# Patient Record
Sex: Male | Born: 1965 | Race: Black or African American | Hispanic: No | Marital: Married | Smoking: Current every day smoker
Health system: Southern US, Community
[De-identification: ages and names within clinical notes are randomized; demographics above are authoritative.]

## PROBLEM LIST (undated history)

## (undated) DIAGNOSIS — Z86718 Personal history of other venous thrombosis and embolism: Secondary | ICD-10-CM

## (undated) DIAGNOSIS — C801 Malignant (primary) neoplasm, unspecified: Secondary | ICD-10-CM

## (undated) DIAGNOSIS — I10 Essential (primary) hypertension: Secondary | ICD-10-CM

## (undated) HISTORY — PX: COLONOSCOPY: SHX174

## (undated) HISTORY — DX: Personal history of other venous thrombosis and embolism: Z86.718

## (undated) HISTORY — PX: TONSILLECTOMY: SUR1361

## (undated) HISTORY — PX: OTHER SURGICAL HISTORY: SHX169

---

## 2003-09-03 ENCOUNTER — Encounter: Admission: RE | Admit: 2003-09-03 | Discharge: 2003-09-03 | Payer: Self-pay | Admitting: Family Medicine

## 2003-09-07 ENCOUNTER — Encounter: Admission: RE | Admit: 2003-09-07 | Discharge: 2003-09-07 | Payer: Self-pay | Admitting: Family Medicine

## 2005-09-05 ENCOUNTER — Encounter: Admission: RE | Admit: 2005-09-05 | Discharge: 2005-09-05 | Payer: Self-pay | Admitting: Family Medicine

## 2006-10-24 ENCOUNTER — Emergency Department (HOSPITAL_COMMUNITY): Admission: EM | Admit: 2006-10-24 | Discharge: 2006-10-24 | Payer: Self-pay | Admitting: Emergency Medicine

## 2007-11-29 IMAGING — CR DG ABDOMEN ACUTE W/ 1V CHEST
3 series · 3 of 3 positions shown · non-contrast
Comparison: 

CLINICAL DATA: 40 year old with abdominal pain, nausea, and chills.
 ACUTE ABDOMINAL SERIES WITH CHEST ? 3 VIEWS:

[w chest pa]
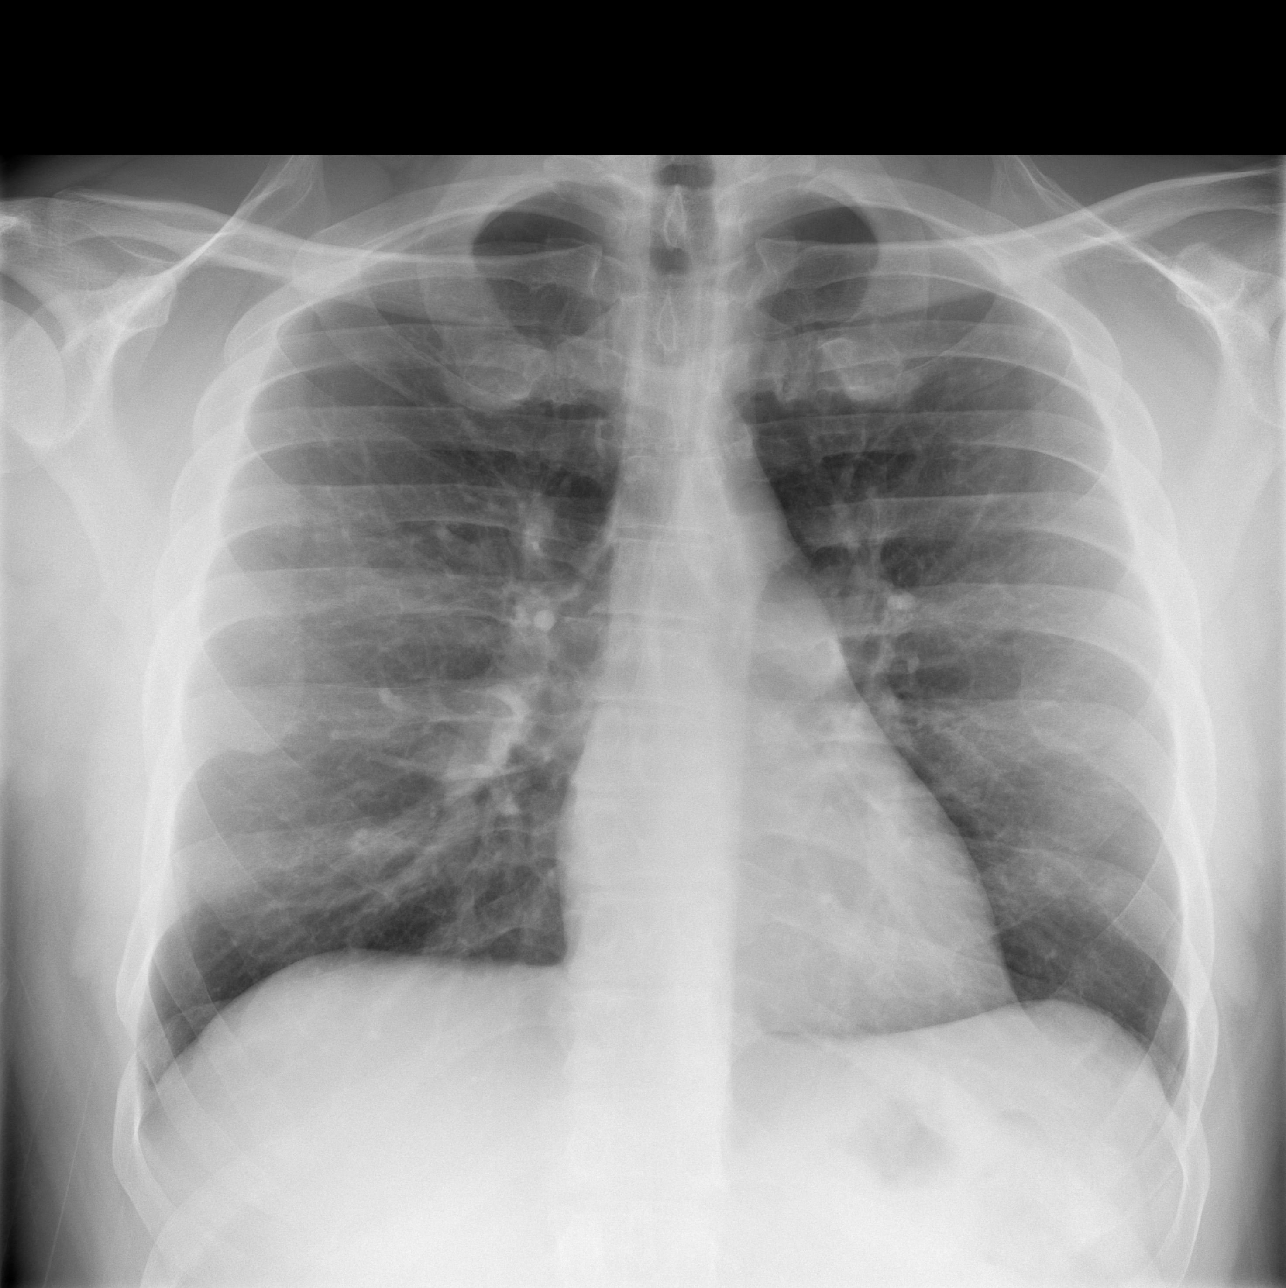

[w abdomen upright]
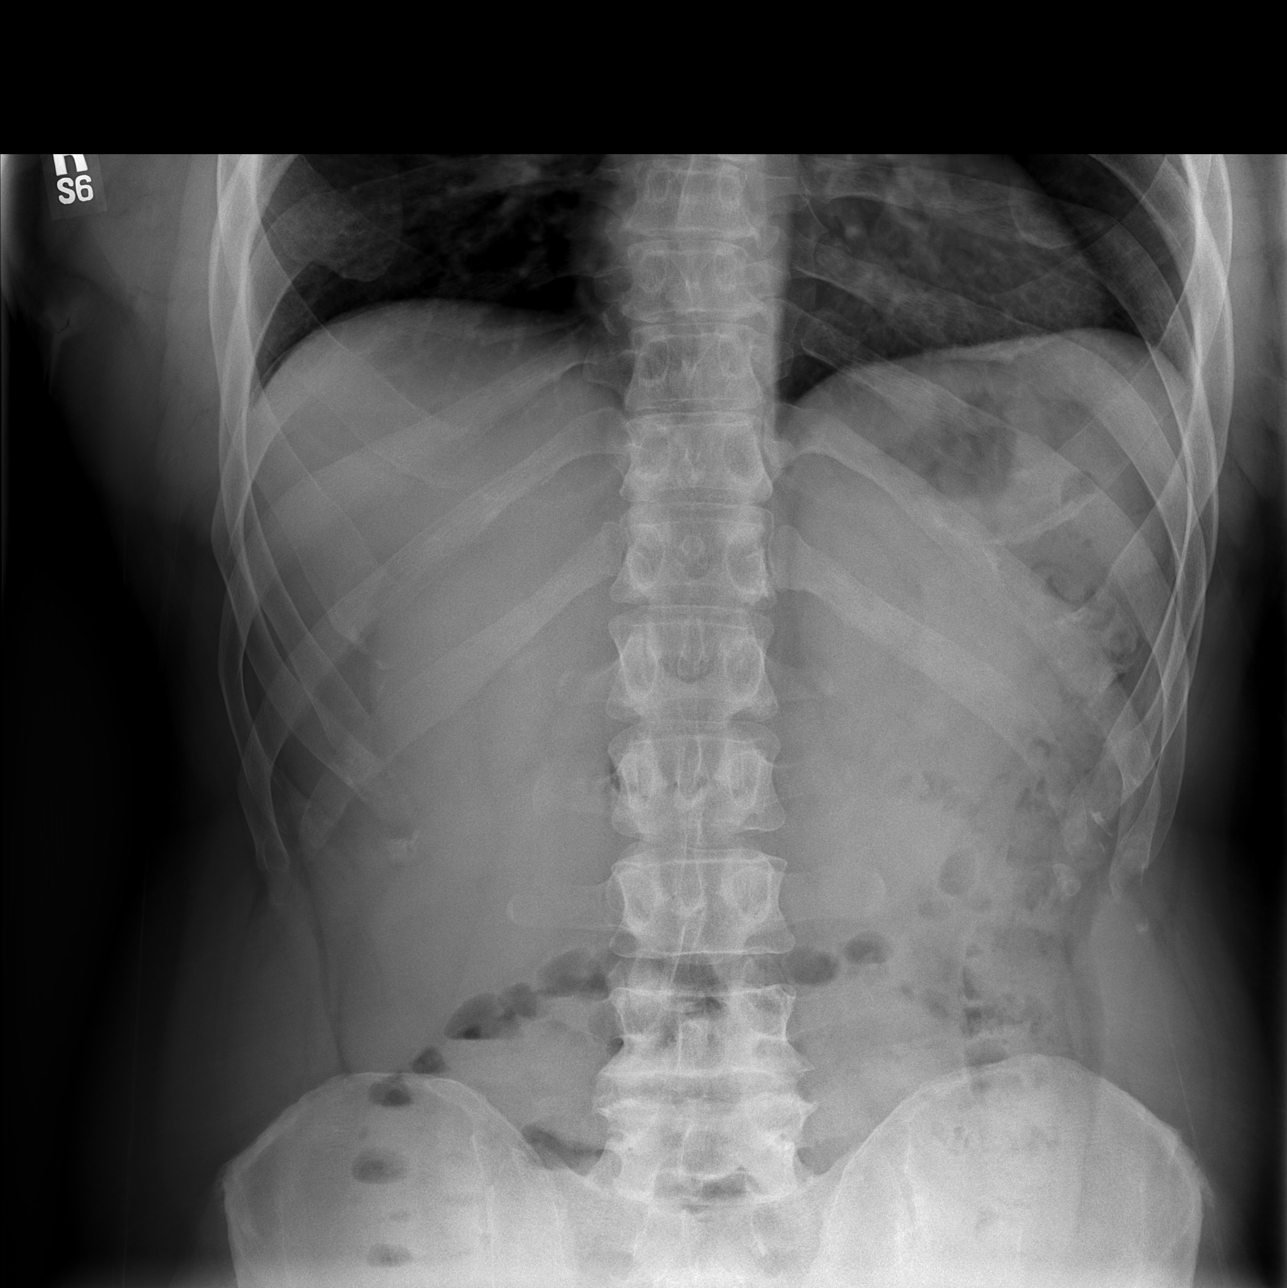

[t abdomen supine *]
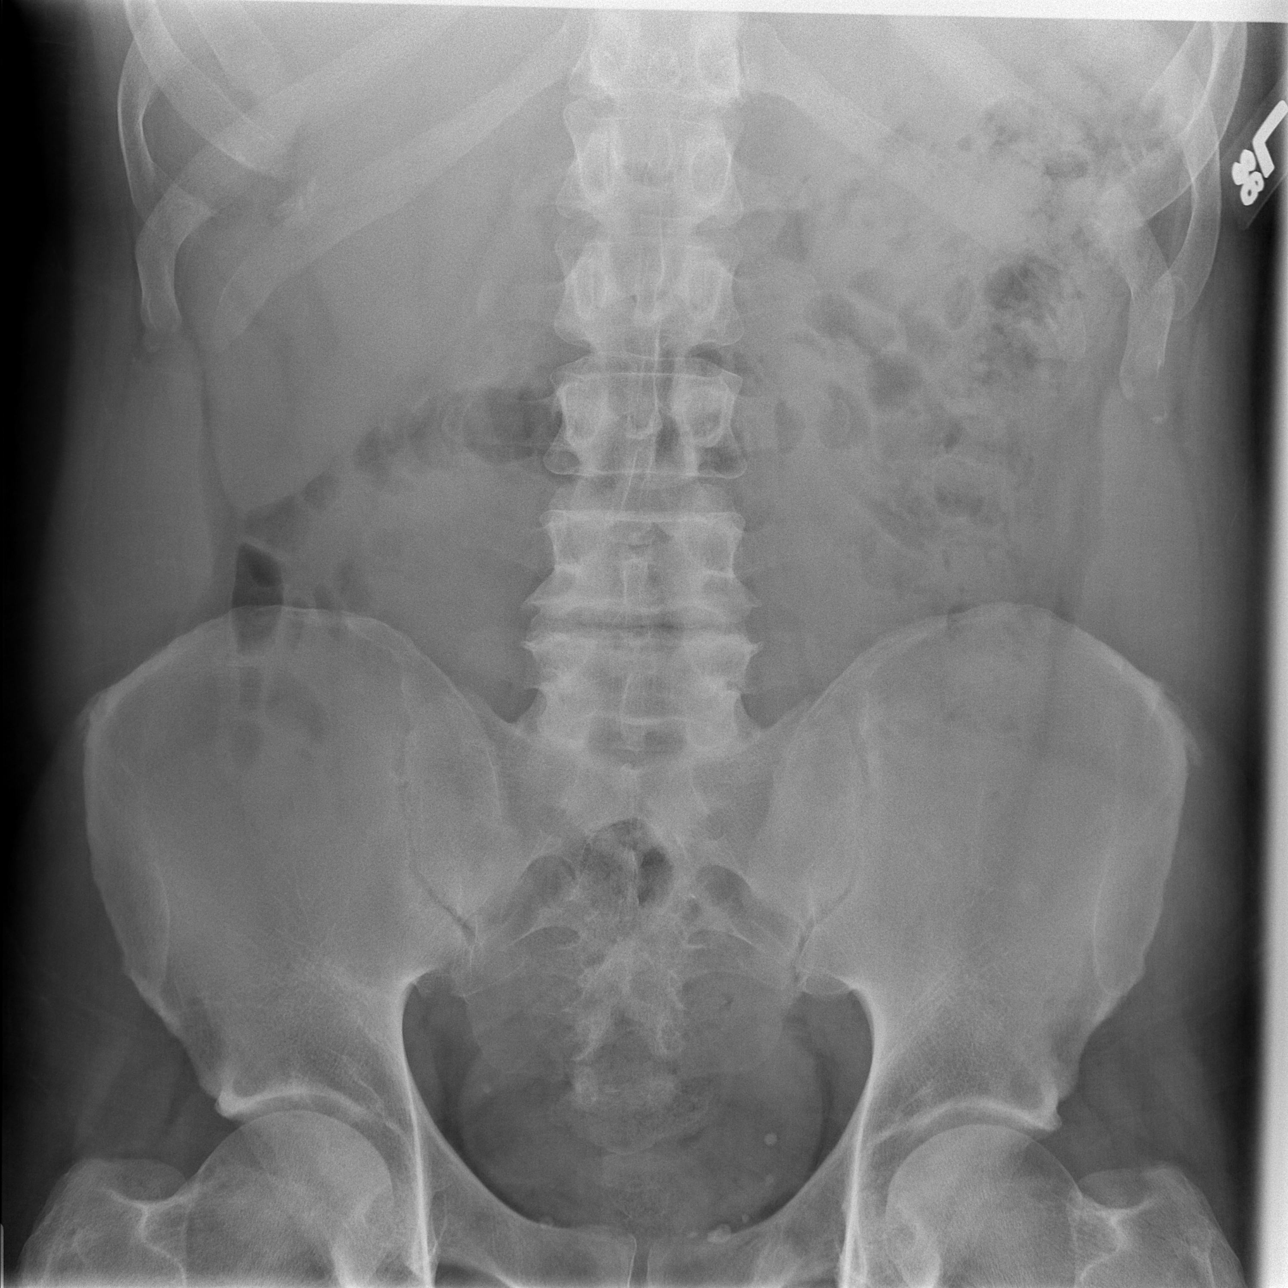

[3 of 3 positions shown; findings below may reference images not displayed]

FINDINGS: A single upright view of the chest demonstrates no acute cardiopulmonary findings.
 Two views of the abdomen demonstrate an unremarkable/nonspecific bowel gas pattern.   There is scattered air and some stool in the colon and a few small bowel loops with air but no distention.  No free air.  Soft tissue shadows of the abdomen are maintained.  No worrisome calcifications are seen.  The bony structures are intact.
IMPRESSION: 1.  No acute cardiopulmonary findings.
 2.  No plain film evidence of an acute abdominal process.

## 2008-03-28 ENCOUNTER — Emergency Department: Payer: Self-pay | Admitting: Emergency Medicine

## 2009-09-27 ENCOUNTER — Encounter (INDEPENDENT_AMBULATORY_CARE_PROVIDER_SITE_OTHER): Payer: Self-pay | Admitting: *Deleted

## 2009-11-03 ENCOUNTER — Encounter (INDEPENDENT_AMBULATORY_CARE_PROVIDER_SITE_OTHER): Payer: Self-pay | Admitting: *Deleted

## 2009-11-04 ENCOUNTER — Ambulatory Visit: Payer: Self-pay | Admitting: Internal Medicine

## 2009-11-15 ENCOUNTER — Ambulatory Visit: Payer: Self-pay | Admitting: Internal Medicine

## 2009-11-18 ENCOUNTER — Encounter: Payer: Self-pay | Admitting: Internal Medicine

## 2010-05-16 NOTE — Letter (Signed)
Summary: Patient Notice- Polyp Results  [facility]  [address] [place], [place] [address]   Phone: [phone]  Fax: [phone]        November 18, 2009 MRN: [id number]    [facility] [address] [place], [place]  [address]    Dear Mr. Wayne Mills,  The polyp removed from your colon was adenomatous. This means that it was pre-cancerous or that  it had the potential to change into cancer over time.  I recommend that you have a repeat colonoscopy in 5 years to determine if you have developed any new polyps over time. If you develop any new rectal bleeding, abdominal pain or significant bowel habit changes, please contact us before then.   Sincerely,  Iva Boop MD, [facility]  This letter has been electronically signed by your physician.  Appended Document: Patient Notice- Polyp Results letter mailed

## 2010-05-16 NOTE — Procedures (Signed)
Summary: Colonoscopy  Patient: Wayne Mills Note: All result statuses are Final unless otherwise noted.  Tests: (1) Colonoscopy (COL)   COL Colonoscopy           DONE     [facility]     [address]     [place], [place]  [address]           COLONOSCOPY PROCEDURE REPORT           PATIENT:  Wayne, Mills  MR#:  [id number]     BIRTHDATE:  May 24, 1965, 43 yrs. old  GENDER:  male     ENDOSCOPIST:  Iva Boop, MD, [facility]     REF. BY:  Lupe Carney, M.D.     PROCEDURE DATE:  11/15/2009     PROCEDURE:  Colonoscopy with snare polypectomy     ASA CLASS:  Class II     INDICATIONS:  Elevated Risk Screening, family history of colon     cancer father with colon cancer at approximately 47 years of age     MEDICATIONS:   Fentanyl 75 mcg IV, Versed 8 mg IV           DESCRIPTION OF PROCEDURE:   After the risks benefits and     alternatives of the procedure were thoroughly explained, informed     consent was obtained.  Digital rectal exam was performed and     revealed no abnormalities and normal prostate.   The LB CF-[id number]     [id number] endoscope was introduced through the anus and advanced to     the cecum, which was identified by both the appendix and ileocecal     valve, without limitations.  The quality of the prep was     excellent, using MoviPrep.  The instrument was then slowly     withdrawn as the colon was fully examined.     Insertion: 2:40 minutes Withdrawal: 10:09 minutes     <<PROCEDUREIMAGES>>           FINDINGS:  A diminutive polyp was found at the splenic flexure. It     was 4 mm in size. Polyp was snared without cautery. Retrieval was     successful. snare polyp  This was otherwise a normal examination     of the colon.   Retroflexed views in the rectum revealed no     abnormalities.    The scope was then withdrawn from the patient     and the procedure completed.           COMPLICATIONS:  None     ENDOSCOPIC IMPRESSION:     1) 4 mm diminutive polyp at the  splenic flexure - removed           2) Otherwise normal examination, excellent prep           3) Family history of colon cancer (father at 13)           REPEAT EXAM:  In for Colonoscopy, pending biopsy results.           Iva Boop, MD, [facility]           CC:  Lupe Carney, MD     The Patient           n.     Rosalie Doctor:   Iva Boop at 11/15/2009 10:19 AM           Lorie Apley, [id number]  Note: An exclamation mark (!) indicates a  result that was not dispersed into the flowsheet. Document Creation Date: 11/15/2009 10:21 AM _______________________________________________________________________  (1) Order result status: Final Collection or observation date-time: 11/15/2009 10:11 Requested date-time:  Receipt date-time:  Reported date-time:  Referring Physician:   Ordering Physician: Stan Head [phone]) Specimen Source:  Source: Launa Grill Order Number: [id number] Lab site:   Appended Document: Colonoscopy     Procedures Next Due Date:    Colonoscopy: 11/2014

## 2010-05-16 NOTE — Letter (Signed)
Summary: Moviprep Instructions  [facility]  [address]   [place], [place] [address]   Phone: [phone]  Fax: [phone]       Wayne Mills    [birth year]    MRN: [id number]        Procedure Day Dorna Bloom: Tuesday, 47-2-11     Arrival Time: 8:30 a.m.      Procedure Time: 9:30 a.m.     Location of Procedure:                    x   [facility] (4th Floor)   PREPARATION FOR COLONOSCOPY WITH MOVIPREP   Starting 5 days prior to your procedure 47-28-11  do not eat nuts, seeds, popcorn, corn, beans, peas,  salads, or any raw vegetables.  Do not take any fiber supplements (e.g. Metamucil, Citrucel, and Benefiber).  THE DAY BEFORE YOUR PROCEDURE         DATE:  47-1-11   DAY: Monday 1.  Drink clear liquids the entire day-NO SOLID FOOD  2.  Do not drink anything colored red or purple.  Avoid juices with pulp.  No orange juice.  3.  Drink at least 64 oz. (8 glasses) of fluid/clear liquids during the day to prevent dehydration and help the prep work efficiently.  CLEAR LIQUIDS INCLUDE: Water Jello Ice Popsicles Tea (sugar ok, no milk/cream) Powdered fruit flavored drinks Coffee (sugar ok, no milk/cream) Gatorade Juice: apple, white grape, white cranberry  Lemonade Clear bullion, consomm, broth Carbonated beverages (any kind) Strained chicken noodle soup Hard Candy                             4.  In the morning, mix first dose of MoviPrep solution:    Empty 1 Pouch A and 1 Pouch B into the disposable container    Add lukewarm drinking water to the top line of the container. Mix to dissolve    Refrigerate (mixed solution should be used within 24 hrs)  5.  Begin drinking the prep at 5:00 p.m. The MoviPrep container is divided by 4 marks.   Every 15 minutes drink the solution down to the next mark (approximately 8 oz) until the full liter is complete.   6.  Follow completed prep with 16 oz of clear liquid of your choice (Nothing red or purple).   Continue to drink clear liquids until bedtime.  7.  Before going to bed, mix second dose of MoviPrep solution:    Empty 1 Pouch A and 1 Pouch B into the disposable container    Add lukewarm drinking water to the top line of the container. Mix to dissolve    Refrigerate  THE DAY OF YOUR PROCEDURE      DATE: 47-2-11   DAY: Tuesday  Beginning at  4:30 a.m. (5 hours before procedure):         1. Every 15 minutes, drink the solution down to the next mark (approx 8 oz) until the full liter is complete.  2. Follow completed prep with 16 oz. of clear liquid of your choice.    3. You may drink clear liquids until  7:30 a.m.  (2 HOURS BEFORE PROCEDURE).   MEDICATION INSTRUCTIONS  Unless otherwise instructed, you should take regular prescription medications with a small sip of water   as early as possible the morning of your procedure.   Additional medication instructions: n/a  OTHER INSTRUCTIONS  You will need a responsible adult at least 47 years of age to accompany you and drive you home.   This person must remain in the waiting room during your procedure.  Wear loose fitting clothing that is easily removed.  Leave jewelry and other valuables at home.  However, you may wish to bring a book to read or  an iPod/MP3 player to listen to music as you wait for your procedure to start.  Remove all body piercing jewelry and leave at home.  Total time from sign-in until discharge is approximately 2-3 hours.  You should go home directly after your procedure and rest.  You can resume normal activities the  day after your procedure.  The day of your procedure you should not:   Drive   Make legal decisions   Operate machinery   Drink alcohol   Return to work  You will receive specific instructions about eating, activities and medications before you leave.    The above instructions have been reviewed and explained to me by  Sherren Kerns RN  November 04, 2045 4:10 PM       I fully understand and can verbalize these instructions _____________________________ Date _________

## 2010-05-16 NOTE — Miscellaneous (Signed)
Summary: direct colon//family hx of cancer/previsit/rm  Clinical Lists Changes  Medications: Added new medication of MOVIPREP 100 GM  SOLR (PEG-KCL-NACL-NASULF-NA ASC-C) As per prep instructions. - Signed Rx of MOVIPREP 100 GM  SOLR (PEG-KCL-NACL-NASULF-NA ASC-C) As per prep instructions.;  #1 x 0;  Signed;  Entered by: Sherren Kerns RN;  Authorized by: Iva Boop MD, [facility];  Method used: [facility]  [address] [phone]*, [address], [place], [place], [place]  [id number], Ph: [phone] or [phone], Fax: [phone] Allergies: Added new allergy or adverse reaction of PENICILLIN Observations: Added new observation of NKA: F (11/04/2009 15:58)    Prescriptions: MOVIPREP 100 GM  SOLR (PEG-KCL-NACL-NASULF-NA ASC-C) As per prep instructions.  #1 x 0   Entered by:   Sherren Kerns RN   Authorized by:   Iva Boop MD, [facility]   Signed by:   Sherren Kerns RN on 11/04/2009   Method used:   [facility]        [facility]  [address] [phone]* (retail)       [address]       [place], [place]  [id number]       Ph: [phone] or [phone]       Fax: [phone]   RxID:   [id number]

## 2010-05-16 NOTE — Letter (Signed)
Summary: Previsit letter  [facility]  [address] [place], [place] [address]   Phone: [phone]  Fax: [phone]       09/27/2009 MRN: [id number]  [facility] [address] [place], [place]  [address]  Dear Wayne Mills,  Welcome to the Gastroenterology Division at [facility].    You are scheduled to see a nurse for your pre-procedure visit on 11-04-09 at 4:00p.m. on the 3rd floor at [facility], [address]  We ask that you try to arrive at our office 15 minutes prior to your appointment time to allow for check-in.  Your nurse visit will consist of discussing your medical and surgical history, your immediate family medical history, and your medications.    Please bring a complete list of all your medications or, if you prefer, bring the medication bottles and we will list them.  We will need to be aware of both prescribed and over the counter drugs.  We will need to know exact dosage information as well.  If you are on blood thinners (Coumadin, Plavix, Aggrenox, Ticlid, etc.) please call our office today/prior to your appointment, as we need to consult with your physician about holding your medication.   Please be prepared to read and sign documents such as consent forms, a financial agreement, and acknowledgement forms.  If necessary, and with your consent, a friend or relative is welcome to sit-in on the nurse visit with you.  Please bring your insurance card so that we may make a copy of it.  If your insurance requires a referral to see a specialist, please bring your referral form from your primary care physician.  No co-pay is required for this nurse visit.     If you cannot keep your appointment, please call [phone] to cancel or reschedule prior to your appointment date.  This allows [place] the opportunity to schedule an appointment for another patient in need of care.    Thank you for choosing [facility] for your medical  needs.  We appreciate the opportunity to care for you.  Please visit [place] at our website  to learn more about our practice.                     Sincerely.                                                                                                                   The Gastroenterology Division

## 2011-01-30 LAB — COMPREHENSIVE METABOLIC PANEL
AST: 34
CO2: 27
Calcium: 8.8
Creatinine, Ser: 1.16
GFR calc Af Amer: >60
Glucose, Bld: 124 — ABNORMAL HIGH
Sodium: 132 — ABNORMAL LOW
Total Protein: 6.8

## 2011-01-30 LAB — DIFFERENTIAL
Basophils Absolute: 0.1
Basophils Relative: 0
Eosinophils Absolute: 0
Lymphocytes Relative: 6 — ABNORMAL LOW
Monocytes Absolute: 1 — ABNORMAL HIGH
Monocytes Relative: 5

## 2011-01-30 LAB — CBC
HCT: 40.8
Hemoglobin: 13.9
MCHC: 34.1
MCV: 89.2
RBC: 4.57

## 2011-01-30 LAB — URINE CULTURE: Colony Count: >=100000

## 2011-01-30 LAB — URINALYSIS, ROUTINE W REFLEX MICROSCOPIC
Ketones, ur: NEGATIVE
Nitrite: POSITIVE — AB
pH: 6

## 2011-01-30 LAB — URINE MICROSCOPIC-ADD ON

## 2011-08-29 DIAGNOSIS — K220 Achalasia of cardia: Secondary | ICD-10-CM | POA: Insufficient documentation

## 2015-01-17 ENCOUNTER — Encounter: Payer: Self-pay | Admitting: Internal Medicine

## 2015-04-27 ENCOUNTER — Encounter (HOSPITAL_COMMUNITY): Payer: Self-pay

## 2015-04-27 ENCOUNTER — Emergency Department (HOSPITAL_COMMUNITY)
Admission: EM | Admit: 2015-04-27 | Discharge: 2015-04-27 | Discharge status: Against Medical Advice | Payer: Self-pay | Attending: Emergency Medicine | Admitting: Emergency Medicine

## 2015-04-27 DIAGNOSIS — F17200 Nicotine dependence, unspecified, uncomplicated: Secondary | ICD-10-CM | POA: Insufficient documentation

## 2015-04-27 DIAGNOSIS — I10 Essential (primary) hypertension: Secondary | ICD-10-CM | POA: Insufficient documentation

## 2015-04-27 DIAGNOSIS — R109 Unspecified abdominal pain: Secondary | ICD-10-CM | POA: Insufficient documentation

## 2015-04-27 HISTORY — DX: Essential (primary) hypertension: I10

## 2015-04-27 LAB — CBC
HEMATOCRIT: 41.9 % (ref 39.0–52.0)
Hemoglobin: 14.1 g/dL (ref 13.0–17.0)
MCH: 30.3 pg (ref 26.0–34.0)
MCHC: 33.7 g/dL (ref 30.0–36.0)
MCV: 90.1 fL (ref 78.0–100.0)
PLATELETS: 217 10*3/uL (ref 150–400)
RBC: 4.65 MIL/uL (ref 4.22–5.81)
RDW: 14.2 % (ref 11.5–15.5)
WBC: 4.7 10*3/uL (ref 4.0–10.5)

## 2015-04-27 LAB — COMPREHENSIVE METABOLIC PANEL
ALK PHOS: 57 U/L (ref 38–126)
ALT: 106 U/L — AB (ref 17–63)
AST: 73 U/L — AB (ref 15–41)
Albumin: 3.8 g/dL (ref 3.5–5.0)
Anion gap: 10 (ref 5–15)
BILIRUBIN TOTAL: 0.4 mg/dL (ref 0.3–1.2)
BUN: 23 mg/dL — ABNORMAL HIGH (ref 6–20)
CHLORIDE: 101 mmol/L (ref 101–111)
CO2: 28 mmol/L (ref 22–32)
CREATININE: 1.59 mg/dL — AB (ref 0.61–1.24)
Calcium: 9.6 mg/dL (ref 8.9–10.3)
GFR, EST AFRICAN AMERICAN: 56 mL/min — AB (ref >60)
GFR, EST NON AFRICAN AMERICAN: 49 mL/min — AB (ref >60)
Glucose, Bld: 113 mg/dL — ABNORMAL HIGH (ref 65–99)
POTASSIUM: 4.9 mmol/L (ref 3.5–5.1)
Sodium: 139 mmol/L (ref 135–145)
Total Protein: 7.3 g/dL (ref 6.5–8.1)

## 2015-04-27 LAB — URINALYSIS, ROUTINE W REFLEX MICROSCOPIC
Bilirubin Urine: NEGATIVE
GLUCOSE, UA: NEGATIVE mg/dL
Hgb urine dipstick: NEGATIVE
Ketones, ur: NEGATIVE mg/dL
LEUKOCYTES UA: NEGATIVE
NITRITE: NEGATIVE
PROTEIN: 100 mg/dL — AB
Specific Gravity, Urine: 1.019 (ref 1.005–1.030)
pH: 5.5 (ref 5.0–8.0)

## 2015-04-27 LAB — URINE MICROSCOPIC-ADD ON

## 2015-04-27 LAB — LIPASE, BLOOD: LIPASE: 39 U/L (ref 11–51)

## 2015-04-27 NOTE — ED Notes (Signed)
Pt states that he feels much better and does not wish to be seen anymore.

## 2015-04-27 NOTE — ED Notes (Signed)
Pt states he ate supper and has been taking bactrim since Saturday for a UTI from the [place]. Started having abd pain after taking the antibiotic tonight and [facility] told [facility] if he was going to start having abd pain he would have had it by now from the medicine. No nausea or vomiting with the medicine.

## 2019-12-27 ENCOUNTER — Other Ambulatory Visit: Payer: Self-pay

## 2019-12-27 ENCOUNTER — Ambulatory Visit (HOSPITAL_COMMUNITY)
Admission: EM | Admit: 2019-12-27 | Discharge: 2019-12-27 | Disposition: A | Discharge status: Home / Self Care | Payer: Non-veteran care

## 2019-12-27 ENCOUNTER — Encounter (HOSPITAL_COMMUNITY): Payer: Self-pay | Admitting: Emergency Medicine

## 2019-12-27 DIAGNOSIS — M7989 Other specified soft tissue disorders: Secondary | ICD-10-CM

## 2019-12-27 MED ORDER — DIPHENHYDRAMINE HCL 25 MG PO TABS: 25.0000 mg | ORAL_TABLET | Freq: Two times a day (BID) | ORAL | 0 refills | Status: DC | PRN

## 2019-12-27 MED ORDER — PREDNISONE 10 MG PO TABS: ORAL_TABLET | ORAL | 0 refills | Status: DC

## 2019-12-27 NOTE — ED Triage Notes (Signed)
Pt c/o right hand pain and swelling onset yesterday upon waking. Pt states he was mowing the lawn on Friday and is unsure if something maybe bit him.

## 2019-12-27 NOTE — Discharge Instructions (Addendum)
Take the benadryl twice daily as needed until symptoms resolve - if this makes you sleepy you may swap out your morning dose for a zyrtec or claritin. Take the full prednisone course as prescribed.

## 2019-12-27 NOTE — ED Provider Notes (Signed)
[place]    CSN: [id number] Arrival date & time: 12/27/19  1026      History   Chief Complaint Chief Complaint  Patient presents with  . Hand Pain    HPI Wayne Mills is a 54 y.o. male.   Here today with 1 day history of right hand swelling and discomfort. States he was mowing the lawn prior to onset, noted a spot on hand that may have been an insect bite this morning. Denies significant pain, numbness, tingling, discoloration, CP, SOB, wheezing, throat itching or swelling. Has not tried anything other than ice so far for sxs. This has never happened before.      Past Medical History:  Diagnosis Date  . Hypertension     There are no problems to display for this patient.   History reviewed. No pertinent surgical history.     Home Medications    Prior to Admission medications   Medication Sig Start Date End Date Taking? Authorizing Provider  amLODipine (NORVASC) 10 MG tablet Take 5 mg by mouth daily.   Yes [provider]  lisinopril (ZESTRIL) 40 MG tablet Take 40 mg by mouth daily.   Yes [provider]  tamsulosin (FLOMAX) 0.4 MG CAPS capsule Take 0.4 mg by mouth.   Yes [provider]  diphenhydrAMINE (BENADRYL) 25 MG tablet Take 1 tablet (25 mg total) by mouth 2 (two) times daily as needed. 12/27/19   Volney American, PA-C  predniSONE (DELTASONE) 10 MG tablet Take 6 tabs day one, 5 tabs day two, 4 tabs day three, etc 12/27/19   Volney American, PA-C    Family History Family History  Problem Relation Age of Onset  . Hypertension Mother   . Hypertension Father     Social History Social History   Tobacco Use  . Smoking status: Current Every Day Smoker    Packs/day: 1.00    Types: Cigars  . Smokeless tobacco: Never Used  . Tobacco comment: 1-2 cigars daily  Vaping Use  . Vaping Use: Never used  Substance Use Topics  . Alcohol use: Yes  . Drug use: No     Allergies   Penicillins   Review  of Systems Review of Systems PER HPI   Physical Exam Triage Vital Signs ED Triage Vitals  Enc Vitals Group     BP 12/27/19 1058 (!) 155/97     Pulse Rate 12/27/19 1058 (!) 56     Resp 12/27/19 1058 15     Temp 12/27/19 1058 97.7 F (36.5 C)     Temp Source 12/27/19 1058 Oral     SpO2 12/27/19 1058 97 %     Weight --      Height --      Head Circumference --      Peak Flow --      Pain Score 12/27/19 1054 1     Pain Loc --      Pain Edu? --      Excl. in [place]? --    No data found.  Updated Vital Signs BP (!) 155/97 (BP Location: Left Arm)   Pulse (!) 56   Temp 97.7 F (36.5 C) (Oral)   Resp 15   SpO2 97%   Visual Acuity Right Eye Distance:   Left Eye Distance:   Bilateral Distance:    Right Eye Near:   Left Eye Near:    Bilateral Near:     Physical Exam Vitals and nursing note  reviewed.  Constitutional:      Appearance: Normal appearance.  HENT:     Head: Atraumatic.  Eyes:     Extraocular Movements: Extraocular movements intact.     Conjunctiva/sclera: Conjunctivae normal.  Cardiovascular:     Rate and Rhythm: Normal rate and regular rhythm.     Pulses: Normal pulses.  Pulmonary:     Effort: Pulmonary effort is normal.     Breath sounds: Normal breath sounds.  Abdominal:     General: Bowel sounds are normal. There is no distension.     Palpations: Abdomen is soft.     Tenderness: There is no abdominal tenderness.  Musculoskeletal:        General: Swelling (moderate edema isolated to right hand diffusely) present. No tenderness or deformity. Normal range of motion.     Cervical back: Normal range of motion and neck supple.  Skin:    General: Skin is warm and dry.     Findings: No erythema.     Comments: Small insect bite appearing lesion on right hand below thumb. No evidence of infection  Neurological:     General: No focal deficit present.     Mental Status: He is oriented to person, place, and time.     Sensory: No sensory deficit.     Motor:  No weakness.  Psychiatric:        Mood and Affect: Mood normal.        Thought Content: Thought content normal.        Judgment: Judgment normal.    UC Treatments / Results  Labs (all labs ordered are listed, but only abnormal results are displayed) Labs Reviewed - No data to display  EKG   Radiology No results found.  Procedures Procedures (including critical care time)  Medications Ordered in UC Medications - No data to display  Initial Impression / Assessment and Plan / UC Course  I have reviewed the triage vital signs and the nursing notes.  Pertinent labs & imaging results that were available during my care of the patient were reviewed by me and considered in my medical decision making (see chart for details).     Possibly allergic reaction to a sting or insect bite. No evidence of infection or generalized sxs developing at this time. WIll tx with prednisone, benadryl, epsom salt soaks, hand elevation. F/u with Primary Care next week if not improving. Go to ER if significantly worsening, including numbness, discoloration, fevers, chills, difficulty breathing.,   Final Clinical Impressions(s) / UC Diagnoses   Final diagnoses:  Swelling of right hand     Discharge Instructions     Take the benadryl twice daily as needed until symptoms resolve - if this makes you sleepy you may swap out your morning dose for a zyrtec or claritin. Take the full prednisone course as prescribed.     ED Prescriptions    Medication Sig Dispense Auth. Provider   predniSONE (DELTASONE) 10 MG tablet Take 6 tabs day one, 5 tabs day two, 4 tabs day three, etc 21 tablet Volney American, PA-C   diphenhydrAMINE (BENADRYL) 25 MG tablet Take 1 tablet (25 mg total) by mouth 2 (two) times daily as needed. 30 tablet Volney American, [place]     PDMP not reviewed this encounter.   Volney American, [place] 12/27/19 1520

## 2020-12-02 ENCOUNTER — Telehealth: Payer: Self-pay | Admitting: Radiation Oncology

## 2020-12-02 NOTE — Telephone Encounter (Signed)
Called patient to schedule consultation with Dr. Tammi Klippel. No answer, LVM for return call.

## 2020-12-06 NOTE — Progress Notes (Signed)
GU Location of Tumor / Histology:   If Prostate Cancer, Gleason Score is (3 + 4), PSA (5.25), and Prostate volume ()  Lolita Lenz presented  months ago with signs/symptoms of:   Biopsies revealed:    Past/Anticipated interventions by urology, if any:   Past/Anticipated interventions by medical oncology, if any:   Weight changes, if any: no  IPSS Score: 15 SHIM Score:21  Bowel/Bladder complaints, if any: no   Nausea/Vomiting, if any: no  Pain issues, if any:  no  SAFETY ISSUES: Prior radiation? no Pacemaker/ICD? no Possible current pregnancy? no Is the patient on methotrexate? no  Current Complaints / other details:   Vitals:   12/13/20 0942  BP: (!) 147/94  Pulse: 62  Resp: 20  SpO2: 96%  Weight: 124.3 kg  Height: '6\' 5"'$  (1.956 m)   Patient blood pressure was elevated denies any s/s of hypertension.

## 2020-12-13 ENCOUNTER — Encounter: Payer: Self-pay | Admitting: Radiation Oncology

## 2020-12-13 ENCOUNTER — Ambulatory Visit
Admission: RE | Admit: 2020-12-13 | Discharge: 2020-12-13 | Disposition: A | Discharge status: Home / Self Care | Payer: No Typology Code available for payment source | Source: Ambulatory Visit | Attending: Radiation Oncology | Admitting: Radiation Oncology

## 2020-12-13 ENCOUNTER — Other Ambulatory Visit: Payer: Self-pay

## 2020-12-13 VITALS — BP 147/94 | HR 62 | Resp 20 | Ht 77.0 in | Wt 274.0 lb

## 2020-12-13 DIAGNOSIS — C61 Malignant neoplasm of prostate: Secondary | ICD-10-CM | POA: Insufficient documentation

## 2020-12-13 DIAGNOSIS — F17210 Nicotine dependence, cigarettes, uncomplicated: Secondary | ICD-10-CM | POA: Diagnosis not present

## 2020-12-13 DIAGNOSIS — Z7982 Long term (current) use of aspirin: Secondary | ICD-10-CM | POA: Insufficient documentation

## 2020-12-13 DIAGNOSIS — I10 Essential (primary) hypertension: Secondary | ICD-10-CM | POA: Diagnosis not present

## 2020-12-13 DIAGNOSIS — Z79899 Other long term (current) drug therapy: Secondary | ICD-10-CM | POA: Diagnosis not present

## 2020-12-13 NOTE — Progress Notes (Signed)
Radiation Oncology         [phone] ________________________________  Initial Outpatient Consultation  Name: Wayne Mills MRN: [id number]  Date: 12/13/2020  DOB: 22-Dec-1965  TD:[id number], No Pcp Per (Inactive)  Remi Haggard, MD   REFERRING PHYSICIAN: Remi Haggard, MD  DIAGNOSIS: 55 y.o. gentleman with Stage T1c adenocarcinoma of the prostate with Gleason score of 3+4, and PSA of 5.25.    ICD-10-CM   1. Malignant neoplasm of prostate ([place])  C61       HISTORY OF PRESENT ILLNESS: Wayne Mills is a 55 y.o. male with a diagnosis of prostate cancer. He was noted to have an elevated PSA of 4.41 in August 2021, by his primary care physician with the [facility]. He underwent prostate MRI on 03/29/20 showing a 12 mm focus of mild signal abnormality in the left posterior and medial peripheral zone at the level of the mid gland with early arterial enhancement (PI-RADS 4); no extraglandular disease or frank adenopathy. Upon further evaluation with the [facility], he was found to have an elevated postvoid residual and ultimately underwent cystoscopy and urethral stricture dilation on 09/09/20. Accordingly, he was referred for evaluation in urology by Dr. Milford Cage on 10/28/20,  digital rectal examination was performed at that time revealing no nodules. A repeat PSA from that day showed further elevation to 5.25. The patient proceeded to transrectal ultrasound with 12 biopsies of the prostate on 11/11/20.  The prostate volume measured 28.77 cc.  Out of 12 core biopsies, only 1 was positive.  The maximum Gleason score was 3+4, and this was seen in the left base lateral (10%).  The patient reviewed the biopsy results with his urologist and he has kindly been referred today for discussion of potential radiation treatment options.   PREVIOUS RADIATION THERAPY: No  PAST MEDICAL HISTORY:  Past Medical History:  Diagnosis Date   Hypertension       PAST SURGICAL HISTORY:No past surgical history on  file.  FAMILY HISTORY:  Family History  Problem Relation Age of Onset   Hypertension Mother    Hypertension Father     SOCIAL HISTORY:  Social History   Socioeconomic History   Marital status: Married    Spouse name: Not on file   Number of children: Not on file   Years of education: Not on file   Highest education level: Not on file  Occupational History   Not on file  Tobacco Use   Smoking status: Every Day    Packs/day: 1.00    Types: Cigars, Cigarettes   Smokeless tobacco: Never   Tobacco comments:    1-2 cigars daily  Vaping Use   Vaping Use: Never used  Substance and Sexual Activity   Alcohol use: Yes   Drug use: No   Sexual activity: Not on file  Other Topics Concern   Not on file  Social History Narrative   Not on file   Social Determinants of Health   Financial Resource Strain: Not on file  Food Insecurity: Not on file  Transportation Needs: Not on file  Physical Activity: Not on file  Stress: Not on file  Social Connections: Not on file  Intimate Partner Violence: Not on file    ALLERGIES: Penicillins  MEDICATIONS:  Current Outpatient Medications  Medication Sig Dispense Refill   amLODipine (NORVASC) 10 MG tablet Take 10 mg by mouth daily.     aspirin 81 MG EC tablet TAKE ONE TABLET BY MOUTH DAILY FOR HEART  Carboxymethylcellulose Sodium 1 % GEL APPLY 1 DROP TO EACH EYE 2 TO 3 TIMES DAILY     clobetasol cream (TEMOVATE) 0.05 % APPLY SMALL AMOUNT TO AFFECTED AREA TWICE A DAY     cycloSPORINE (RESTASIS) 0.05 % ophthalmic emulsion INSTILL 1 DROP IN EACH EYE EVERY 12 HOURS     ketotifen (ZADITOR) 0.025 % ophthalmic solution INSTILL 1 DROP IN EACH EYE ONCE OR TWICE DAILY AS NEEDED     lidocaine (LIDODERM) 5 % APPLY 1 PATCH TO SKIN ONCE DAILY (APPLY FOR 12 HOURS, THEN REMOVE FOR 12 HOURS)     lisinopril (ZESTRIL) 40 MG tablet Take 40 mg by mouth daily.     Polyvinyl Alcohol-Povidone PF 1.4-0.6 % SOLN INSTILL 2 DROPS IN EACH EYE AS DIRECTED BY YOUR  MEDICAL PROVIDER *MAY USE 2 TO 4 TIMES A DAY     tamsulosin (FLOMAX) 0.4 MG CAPS capsule Take 0.4 mg by mouth.     diphenhydrAMINE (BENADRYL) 25 MG tablet Take 1 tablet (25 mg total) by mouth 2 (two) times daily as needed. (Patient not taking: Reported on 12/13/2020) 30 tablet 0   predniSONE (DELTASONE) 10 MG tablet Take 6 tabs day one, 5 tabs day two, 4 tabs day three, etc (Patient not taking: Reported on 12/13/2020) 21 tablet 0   No current facility-administered medications for this encounter.    REVIEW OF SYSTEMS:  On review of systems, the patient reports that he is doing well overall. He denies any chest pain, shortness of breath, cough, fevers, chills, night sweats, unintended weight changes. He denies any bowel disturbances, and denies abdominal pain, nausea or vomiting. He denies any new musculoskeletal or joint aches or pains. His IPSS was 15, indicating moderate urinary symptoms. His SHIM was 21, indicating he has mild erectile dysfunction. A complete review of systems is obtained and is otherwise negative.    PHYSICAL EXAM:  Wt Readings from Last 3 Encounters:  12/13/20 274 lb (124.3 kg)  04/27/15 235 lb (106.6 kg)   Temp Readings from Last 3 Encounters:  12/27/19 97.7 F (36.5 C) (Oral)  04/27/15 97.6 F (36.4 C) (Oral)   BP Readings from Last 3 Encounters:  12/13/20 (!) 147/94  12/27/19 (!) 155/97  04/27/15 (!) 177/101   Pulse Readings from Last 3 Encounters:  12/13/20 62  12/27/19 (!) 56  04/27/15 (!) 52   Pain Assessment Pain Score: 0-No pain/10  In general this is a well appearing African-American male in no acute distress. He's alert and oriented x4 and appropriate throughout the examination. Cardiopulmonary assessment is negative for acute distress, and he exhibits normal effort.     KPS = 100  100 - Normal; no complaints; no evidence of disease. 90   - Able to carry on normal activity; minor signs or symptoms of disease. 80   - Normal activity with effort;  some signs or symptoms of disease. 63   - Cares for self; unable to carry on normal activity or to do active work. 60   - Requires occasional assistance, but is able to care for most of his personal needs. 50   - Requires considerable assistance and frequent medical care. 72   - Disabled; requires special care and assistance. 21   - Severely disabled; hospital admission is indicated although death not imminent. 60   - Very sick; hospital admission necessary; active supportive treatment necessary. 10   - Moribund; fatal processes progressing rapidly. 0     - Dead  Karnofsky DA, [place],  Craver LS and Burchenal JH (1948) The use of the nitrogen mustards in the palliative treatment of carcinoma: with particular reference to bronchogenic carcinoma Cancer 1 634-56  LABORATORY DATA:  Lab Results  Component Value Date   WBC 4.7 04/27/2015   HGB 14.1 04/27/2015   HCT 41.9 04/27/2015   MCV 90.1 04/27/2015   PLT 217 04/27/2015   Lab Results  Component Value Date   NA 139 04/27/2015   K 4.9 04/27/2015   CL 101 04/27/2015   CO2 28 04/27/2015   Lab Results  Component Value Date   ALT 106 (H) 04/27/2015   AST 73 (H) 04/27/2015   ALKPHOS 57 04/27/2015   BILITOT 0.4 04/27/2015     RADIOGRAPHY: No results found.    IMPRESSION/PLAN: 1. 55 y.o. gentleman with Stage T1c adenocarcinoma of the prostate with Gleason Score of 3+4, and PSA of 5.25. We discussed the patient's workup and outlined the nature of prostate cancer in this setting. The patient's T stage, Gleason's score, and PSA put him into the favorable intermediate risk group. Accordingly, he is eligible for a variety of potential treatment options including brachytherapy, 5.5 weeks of external radiation, or prostatectomy. We discussed the available radiation techniques, and focused on the details and logistics of delivery. We discussed and outlined the risks, benefits, short and long-term effects associated with radiotherapy and  compared and contrasted these with prostatectomy. We discussed the role of SpaceOAR gel in reducing the rectal toxicity associated with radiotherapy. He appears to have a good understanding of his disease and our treatment recommendations which are of curative intent.  He was encouraged to ask questions that were answered to his stated satisfaction.  At the conclusion of our conversation, the patient is interested in moving forward with brachytherapy and use of SpaceOAR gel to reduce rectal toxicity from radiotherapy.  We will share our discussion with Dr. Milford Cage and move forward with scheduling his [facility] planning appointment in the near future.  The patient met briefly with Romie Jumper in our office who will be working closely with him to coordinate OR scheduling and pre and post procedure appointments.  We will contact the pharmaceutical rep to ensure that [facility] is available at the time of procedure.  We enjoyed meeting him today and look forward to continuing to participate in his care.   We personally spent 75 minutes in this encounter including chart review, reviewing radiological studies, meeting face-to-face with the patient, entering orders and completing documentation.    Nicholos Johns, PA-C    Tyler Pita, MD  [facility] Direct Dial: [phone]  Fax: [phone] [email or web address]  Skype  LinkedIn   This document serves as a record of services personally performed by Tyler Pita, MD and Freeman Caldron, PA-C. It was created on their behalf by Wilburn Mylar, a trained medical scribe. The creation of this record is based on the scribe's personal observations and the provider's statements to them. This document has been checked and approved by the attending provider.

## 2020-12-16 ENCOUNTER — Telehealth: Payer: Self-pay | Admitting: *Deleted

## 2020-12-16 NOTE — Telephone Encounter (Signed)
CALLED PATIENT TO INFORM OF PRE-SEED APPTS. FOR 02-02-21, SPOKE WITH PATIENT AND HE IS AWARE OF THESE APPTS.

## 2021-01-11 ENCOUNTER — Telehealth: Payer: Self-pay | Admitting: *Deleted

## 2021-01-11 ENCOUNTER — Other Ambulatory Visit: Payer: Self-pay | Admitting: Urology

## 2021-01-11 NOTE — Telephone Encounter (Signed)
Called patient to inform ot implant date, lvm for a return call

## 2021-01-25 ENCOUNTER — Other Ambulatory Visit: Payer: Self-pay | Admitting: Urology

## 2021-01-26 ENCOUNTER — Other Ambulatory Visit: Payer: Self-pay | Admitting: Urology

## 2021-01-31 ENCOUNTER — Telehealth: Payer: Self-pay | Admitting: *Deleted

## 2021-01-31 NOTE — Telephone Encounter (Signed)
Called patient to remind of pre-seed appts. for 02-02-21, spoke with patient and he is aware of these appts.

## 2021-02-02 ENCOUNTER — Encounter (HOSPITAL_COMMUNITY)
Admission: RE | Admit: 2021-02-02 | Discharge: 2021-02-02 | Disposition: A | Discharge status: Home / Self Care | Payer: No Typology Code available for payment source | Source: Ambulatory Visit | Attending: Urology | Admitting: Urology

## 2021-02-02 ENCOUNTER — Ambulatory Visit (HOSPITAL_COMMUNITY)
Admission: RE | Admit: 2021-02-02 | Discharge: 2021-02-02 | Disposition: A | Discharge status: Home / Self Care | Payer: No Typology Code available for payment source | Source: Ambulatory Visit | Attending: Urology | Admitting: Urology

## 2021-02-02 ENCOUNTER — Other Ambulatory Visit (HOSPITAL_COMMUNITY): Payer: Self-pay | Admitting: Urology

## 2021-02-02 ENCOUNTER — Ambulatory Visit
Admission: RE | Admit: 2021-02-02 | Discharge: 2021-02-02 | Disposition: A | Discharge status: Home / Self Care | Payer: Non-veteran care | Source: Ambulatory Visit | Attending: Urology | Admitting: Urology

## 2021-02-02 ENCOUNTER — Encounter: Payer: Self-pay | Admitting: Urology

## 2021-02-02 ENCOUNTER — Ambulatory Visit
Admission: RE | Admit: 2021-02-02 | Discharge: 2021-02-02 | Disposition: A | Discharge status: Home / Self Care | Payer: No Typology Code available for payment source | Source: Ambulatory Visit | Attending: Radiation Oncology | Admitting: Radiation Oncology

## 2021-02-02 ENCOUNTER — Ambulatory Visit (HOSPITAL_COMMUNITY): Payer: Non-veteran care

## 2021-02-02 ENCOUNTER — Other Ambulatory Visit: Payer: Self-pay

## 2021-02-02 DIAGNOSIS — C61 Malignant neoplasm of prostate: Secondary | ICD-10-CM | POA: Diagnosis present

## 2021-02-02 DIAGNOSIS — Z01818 Encounter for other preprocedural examination: Secondary | ICD-10-CM

## 2021-02-02 DIAGNOSIS — Z419 Encounter for procedure for purposes other than remedying health state, unspecified: Secondary | ICD-10-CM

## 2021-02-02 NOTE — Progress Notes (Signed)
Patient states doing well. No symptoms reported at this time.  Currently on Flomax 0.4mg  as directed and urology follow-up scheduled for 02/11/21 -per patient.  I-PSS Score of 1 (mild).  Meaningful use complete.

## 2021-02-02 NOTE — Progress Notes (Signed)
  Radiation Oncology         [phone] ________________________________  Name: Wayne Mills MRN: [id number]  Date: 02/02/2021  DOB: 06-28-1965  SIMULATION AND TREATMENT PLANNING NOTE PUBIC ARCH STUDY  HD:[id number], Alda Berthold, MD  DIAGNOSIS: 55 y.o. gentleman with Stage T1c adenocarcinoma of the prostate with Gleason score of 3+4, and PSA of 5.25.  Oncology History  Malignant neoplasm of prostate ([place])  11/11/2020 Cancer Staging   Staging form: Prostate, AJCC 8th Edition - Clinical stage from 11/11/2020: Stage IIB (cT1c, cN0, cM0, PSA: 5.3, Grade Group: 2) - Signed by Freeman Caldron, PA-C on 12/13/2020 Histopathologic type: Adenocarcinoma, NOS Stage prefix: Initial diagnosis Prostate specific antigen (PSA) range: Less than 10 Gleason primary pattern: 3 Gleason secondary pattern: 4 Gleason score: 7 Histologic grading system: 5 grade system Number of biopsy cores examined: 12 Number of biopsy cores positive: 1 Location of positive needle core biopsies: One side   12/13/2020 Initial Diagnosis   Malignant neoplasm of prostate ([place])     No diagnosis found.  COMPLEX SIMULATION:  The patient presented today for evaluation for possible prostate seed implant. He was brought to the radiation planning suite and placed supine on the CT couch. A 3-dimensional image study set was obtained in upload to the planning computer. There, on each axial slice, I contoured the prostate gland. Then, using three-dimensional radiation planning tools I reconstructed the prostate in view of the structures from the transperineal needle pathway to assess for possible pubic arch interference. In doing so, I did not appreciate any pubic arch interference. Also, the patient's prostate volume was estimated based on the drawn structure. The volume was 28 cc.  Given the pubic arch appearance and prostate volume, patient remains a good candidate to proceed with prostate seed implant. Today, he  freely provided informed written consent to proceed.    PLAN: The patient will undergo prostate seed implant.   ________________________________  Sheral Apley. Tammi Klippel, M.D.

## 2021-02-03 ENCOUNTER — Encounter: Payer: Self-pay | Admitting: *Deleted

## 2021-02-03 NOTE — Progress Notes (Signed)
[facility] Psychosocial Distress Screening Clinical Social Work  Clinical Social Work was referred by distress screening protocol.  The patient scored a 7 on the Psychosocial Distress Thermometer which indicates mild distress. Clinical Social Worker contacted patient by phone to assess for distress and other psychosocial needs.   Mr. Bitting reported no concerns at this time. He shared he was experiencing some nervousness prior to his appointment yesterday, but has since resolved after getting all his questions answered by the medical team and being informed of the plan/next steps.  CSW encouraged patient to call with any questions or concerns.  ONCBCN DISTRESS SCREENING 02/02/2021  Screening Type   Distress experienced in past week (1-10) 7  Emotional problem type Adjusting to illness;Nervousness/Anxiety    Clinical Social Worker follow up needed: No.  If yes, follow up plan:  Gwinda Maine, LCSW

## 2021-03-06 ENCOUNTER — Telehealth: Payer: Self-pay | Admitting: *Deleted

## 2021-03-06 ENCOUNTER — Encounter (HOSPITAL_BASED_OUTPATIENT_CLINIC_OR_DEPARTMENT_OTHER): Payer: Self-pay | Admitting: Urology

## 2021-03-06 ENCOUNTER — Other Ambulatory Visit: Payer: Self-pay

## 2021-03-06 NOTE — Telephone Encounter (Signed)
CALLED PATIENT TO REMIND OF LAB APPT. FOR 03-07-21, SPOKE WITH PATIENT AND HE IS AWARE OF THIS APPT.

## 2021-03-06 NOTE — Progress Notes (Signed)
Spoke w/ via phone for pre-op interview--- [facility]----  Preop labs (11/22), EKG, CXR in Epic             Lab results------ COVID test -----patient states asymptomatic no test needed Arrive at ------- 1145 NPO after MN NO Solid Food.  Clear liquids from MN until---1045 Med rec completed Medications to take morning of surgery ----- Norvasc and Flomax. Diabetic medication ----- Patient instructed no nail polish to be worn day of surgery Patient instructed to bring photo id and insurance card day of surgery Patient aware to have Driver (ride ) / caregiver    for 24 hours after surgery  Patient Special Instructions ----- Pre-Op special Istructions ----- Fleets enema AM of surgery. Patient verbalized understanding of instructions that were given at this phone interview. Patient denies shortness of breath, chest pain, fever, cough at this phone interview.

## 2021-03-07 ENCOUNTER — Encounter (HOSPITAL_COMMUNITY)
Admission: RE | Admit: 2021-03-07 | Discharge: 2021-03-07 | Disposition: A | Discharge status: Home / Self Care | Payer: No Typology Code available for payment source | Source: Ambulatory Visit | Attending: Urology | Admitting: Urology

## 2021-03-07 DIAGNOSIS — Z01812 Encounter for preprocedural laboratory examination: Secondary | ICD-10-CM | POA: Diagnosis present

## 2021-03-07 LAB — CBC
HCT: 45.9 % (ref 39.0–52.0)
Hemoglobin: 15.1 g/dL (ref 13.0–17.0)
MCH: 30.2 pg (ref 26.0–34.0)
MCHC: 32.9 g/dL (ref 30.0–36.0)
MCV: 91.8 fL (ref 80.0–100.0)
Platelets: 200 10*3/uL (ref 150–400)
RBC: 5 MIL/uL (ref 4.22–5.81)
RDW: 13.4 % (ref 11.5–15.5)
WBC: 3.6 10*3/uL — ABNORMAL LOW (ref 4.0–10.5)
nRBC: 0 % (ref 0.0–0.2)

## 2021-03-07 LAB — COMPREHENSIVE METABOLIC PANEL
ALT: 48 U/L — ABNORMAL HIGH (ref 0–44)
AST: 40 U/L (ref 15–41)
Albumin: 4 g/dL (ref 3.5–5.0)
Alkaline Phosphatase: 52 U/L (ref 38–126)
Anion gap: 7 (ref 5–15)
BUN: 30 mg/dL — ABNORMAL HIGH (ref 6–20)
CO2: 28 mmol/L (ref 22–32)
Calcium: 8.9 mg/dL (ref 8.9–10.3)
Chloride: 105 mmol/L (ref 98–111)
Creatinine, Ser: 1.68 mg/dL — ABNORMAL HIGH (ref 0.61–1.24)
GFR, Estimated: 47 mL/min — ABNORMAL LOW (ref >60)
Glucose, Bld: 104 mg/dL — ABNORMAL HIGH (ref 70–99)
Potassium: 4.1 mmol/L (ref 3.5–5.1)
Sodium: 140 mmol/L (ref 135–145)
Total Bilirubin: 0.6 mg/dL (ref 0.3–1.2)
Total Protein: 7.4 g/dL (ref 6.5–8.1)

## 2021-03-07 LAB — APTT: aPTT: 36 seconds (ref 24–36)

## 2021-03-07 LAB — PROTIME-INR
INR: 0.9 (ref 0.8–1.2)
Prothrombin Time: 12.3 seconds (ref 11.4–15.2)

## 2021-03-13 ENCOUNTER — Telehealth: Payer: Self-pay | Admitting: *Deleted

## 2021-03-13 NOTE — H&P (Signed)
Patient is a 55 year old African American male referred to the [facility] for evaluation of marginal PSA elevation of 4.41 drawn back in December 04, 2019. He was referred to the [facility] and during evaluation noted to have elevated postvoid residual which ultimately led to a cystoscopy in finding of urethral stricture which was dilated on 09/09/2020 and resultant Foley catheter for several days. Foley was removed he is now voiding satisfactorily. He also takes tamsulosin 0.4 mg daily for enlarged prostate.  There is no family history of prostate cancer. Patient did have an MRI scan of the prostate which showed a questionable small lesion in the left posterior medial peripheral zone read as a possible Pi RAD 4 lesion. Prostatic volume noted to be 26 cc The patient has not had a biopsy prior to the MRI scan. Due to the back up in the [place] system was for referred to [facility].  -11/11/20-patient with history of PSA elevation as above. Repeat value on 10/28/2020 is 5.25. Recommended he undergo transrectal ultrasound prostate biopsy. Here for prostate biopsy  -11/21/20-patient with history of recent PSA elevation of 5.25. Underwent transrectal ultrasound prostate biopsy on 11/11/2020 which shows 1/12 biopsies positive for Gleason 3+4=7 adenocarcinoma the prostate with 10% of the core. Had 1 area of atypia in the left apical region. The patient had no postoperative/procedural issues     ALLERGIES: No Allergies Penicillin pnc    MEDICATIONS: Aspirin  Lisinopril  Amlodipine Besylate  Flomax     GU PSH: Prostate Needle Biopsy - 11/11/2020       PSH Notes: Knee Surgery   NON-GU PSH: Esophagus Endoscopy Surgical Pathology, Gross And Microscopic Examination For Prostate Needle - 11/11/2020     GU PMH: Elevated PSA - 11/11/2020, - 10/28/2020 BPH w/LUTS - 10/28/2020 Incomplete bladder emptying - 10/28/2020 Acute Cystitis/UTI, Acute cystitis without hematuria - 2014      PMH Notes:   1898-04-16 00:00:00 - Note: Normal Routine History And Physical Adult   NON-GU PMH: Cardiac murmur, unspecified, Murmurs - 2014 Personal history of other diseases of the digestive system, History of esophageal reflux - 2014 Arthritis DVT, History GERD Hypertension    FAMILY HISTORY: Death In The Family Mother - Mother Family Health Status Number - Runs In Family Prostate Cancer - Father   SOCIAL HISTORY: Marital Status: Married Preferred Language: English; Ethnicity: Not Hispanic Or Latino; Race: Black or African American Current Smoking Status: Patient has never smoked.   Tobacco Use Assessment Completed: Used Tobacco in last 30 days? Does not use smokeless tobacco. Does not use drugs. Drinks 3 caffeinated drinks per day. Has not had a blood transfusion.     Notes: Tobacco Use, Alcohol Use, Marital History - Currently Married, Occupation:, Caffeine Use   REVIEW OF SYSTEMS:    GU Review Male:   Patient denies frequent urination, hard to postpone urination, burning/ pain with urination, get up at night to urinate, leakage of urine, stream starts and stops, trouble starting your stream, have to strain to urinate , erection problems, and penile pain.  Gastrointestinal (Upper):   Patient denies nausea, vomiting, and indigestion/ heartburn.  Gastrointestinal (Lower):   Patient denies diarrhea and constipation.  Constitutional:   Patient denies fever, night sweats, weight loss, and fatigue.  Skin:   Patient denies skin rash/ lesion and itching.  Eyes:   Patient denies blurred vision and double vision.  Ears/ Nose/ Throat:   Patient denies sore throat and sinus problems.  Hematologic/Lymphatic:   Patient denies swollen  glands and easy bruising.  Cardiovascular:   Patient denies leg swelling and chest pains.  Respiratory:   Patient denies shortness of breath and cough.  Endocrine:   Patient denies excessive thirst.  Musculoskeletal:   Patient denies back pain and joint pain.   Neurological:   Patient denies headaches and dizziness.  Psychologic:   Patient denies depression and anxiety.   VITAL SIGNS: None   Complexity of Data:  Source Of History:  Patient  Records Review:   Pathology Reports, Previous Doctor Records, Previous Hospital Records  Urine Test Review:   Urinalysis   10/28/20  PSA  Total PSA 5.25 ng/mL  Free PSA 0.52 ng/mL  % Free PSA 10 % PSA    PROCEDURES:          Urinalysis w/Scope Dipstick Dipstick Cont'd Micro  Color: Yellow Bilirubin: Neg mg/dL WBC/hpf: 0 - 5/hpf  Appearance: Clear Ketones: Neg mg/dL RBC/hpf: 0 - 2/hpf  Specific Gravity: 1.020 Blood: Neg ery/uL Bacteria: Few (10-25/hpf)  pH: 5.5 Protein: 2+ mg/dL Cystals: NS (Not Seen)  Glucose: Neg mg/dL Urobilinogen: 0.2 mg/dL Casts: Hyaline    Nitrites: Neg Trichomonas: Not Present    Leukocyte Esterase: Neg leu/uL Mucous: Not Present      Epithelial Cells: 0 - 5/hpf      Yeast: NS (Not Seen)      Sperm: Not Present    Notes: granular cast    ASSESSMENT:      ICD-10 Details  1 GU:   Prostate Cancer - Z61 Acute, Complicated Injury     PLAN:    Interstitial brachytherapy with space oar gel implant.               Document Letter(s):  Created for Patient: Clinical Summary         Notes:   We discussed pathology results in detail with patient wife today. We discussed treatment options including observation versus surgical prostatectomy versus possible radiation treatment. We are going to set up consultation to see Dr. Tammi Klippel at [facility] and then after he has a chance to discuss things he will decide whether he would desire to proceed with definitive management with radiation surgery or close observation. Prostate cancer information book given today as well. I will wait to hear about results regarding his radiation consultation  Addendum: Patient elected for interstitial brachytherapy and Space Oar gel implant. Risks/benefits of procedure have been discussed  in detail, agrees to proceed.

## 2021-03-13 NOTE — Progress Notes (Signed)
Spoke with patient by phone and reviewed pre op instructions

## 2021-03-13 NOTE — Telephone Encounter (Signed)
CALLED PATIENT TO REMIND OF PROCEDURE FOR 03-04-21, SPOKE WITH PATIENT AND HE IS AWARE OF THIS PROCEDURE

## 2021-03-14 ENCOUNTER — Ambulatory Visit (HOSPITAL_BASED_OUTPATIENT_CLINIC_OR_DEPARTMENT_OTHER)
Admission: RE | Admit: 2021-03-14 | Discharge: 2021-03-14 | Disposition: A | Discharge status: Home / Self Care | Payer: No Typology Code available for payment source | Attending: Urology | Admitting: Urology

## 2021-03-14 ENCOUNTER — Ambulatory Visit (HOSPITAL_BASED_OUTPATIENT_CLINIC_OR_DEPARTMENT_OTHER): Payer: No Typology Code available for payment source | Admitting: Anesthesiology

## 2021-03-14 ENCOUNTER — Encounter (HOSPITAL_BASED_OUTPATIENT_CLINIC_OR_DEPARTMENT_OTHER)
Admission: RE | Disposition: A | Discharge status: Home / Self Care | Payer: Self-pay | Source: Home / Self Care | Attending: Urology

## 2021-03-14 ENCOUNTER — Other Ambulatory Visit: Payer: Self-pay

## 2021-03-14 ENCOUNTER — Encounter (HOSPITAL_BASED_OUTPATIENT_CLINIC_OR_DEPARTMENT_OTHER): Payer: Self-pay | Admitting: Urology

## 2021-03-14 ENCOUNTER — Ambulatory Visit (HOSPITAL_COMMUNITY): Payer: No Typology Code available for payment source

## 2021-03-14 DIAGNOSIS — C61 Malignant neoplasm of prostate: Secondary | ICD-10-CM | POA: Insufficient documentation

## 2021-03-14 HISTORY — PX: SPACE OAR INSTILLATION: SHX6769

## 2021-03-14 HISTORY — PX: RADIOACTIVE SEED IMPLANT: SHX5150

## 2021-03-14 HISTORY — DX: Malignant (primary) neoplasm, unspecified: C80.1

## 2021-03-14 SURGERY — INSERTION, RADIATION SOURCE, PROSTATE
Anesthesia: General | Site: Prostate

## 2021-03-14 MED ORDER — PROPOFOL 10 MG/ML IV BOLUS
INTRAVENOUS | Status: DC | PRN
  Administered 2021-03-14: 200 mg via INTRAVENOUS

## 2021-03-14 MED ORDER — OXYCODONE HCL 5 MG PO TABS: 5.0000 mg | ORAL_TABLET | Freq: Once | ORAL | Status: DC | PRN

## 2021-03-14 MED ORDER — IOHEXOL 300 MG/ML  SOLN
INTRAMUSCULAR | Status: DC | PRN
  Administered 2021-03-14: 7 mL

## 2021-03-14 MED ORDER — FLEET ENEMA 7-19 GM/118ML RE ENEM: 1.0000 | ENEMA | Freq: Once | RECTAL | Status: DC

## 2021-03-14 MED ORDER — TRAMADOL HCL 50 MG PO TABS: 50.0000 mg | ORAL_TABLET | Freq: Four times a day (QID) | ORAL | 0 refills | Status: DC | PRN

## 2021-03-14 MED ORDER — FENTANYL CITRATE (PF) 100 MCG/2ML IJ SOLN: 25.0000 ug | INTRAMUSCULAR | Status: DC | PRN

## 2021-03-14 MED ORDER — SODIUM CHLORIDE (PF) 0.9 % IJ SOLN
INTRAMUSCULAR | Status: DC | PRN
  Administered 2021-03-14: 10 mL

## 2021-03-14 MED ORDER — ONDANSETRON HCL 4 MG/2ML IJ SOLN
INTRAMUSCULAR | Status: DC | PRN
  Administered 2021-03-14: 4 mg via INTRAVENOUS

## 2021-03-14 MED ORDER — SODIUM CHLORIDE 0.9 % IV SOLN
INTRAVENOUS | Status: AC | PRN
  Administered 2021-03-14: 1000 mL

## 2021-03-14 MED ORDER — LACTATED RINGERS IV SOLN: INTRAVENOUS | Status: DC

## 2021-03-14 MED ORDER — SUGAMMADEX SODIUM 200 MG/2ML IV SOLN
INTRAVENOUS | Status: DC | PRN
  Administered 2021-03-14: 200 mg via INTRAVENOUS

## 2021-03-14 MED ORDER — LIDOCAINE 2% (20 MG/ML) 5 ML SYRINGE
INTRAMUSCULAR | Status: DC | PRN
  Administered 2021-03-14: 60 mg via INTRAVENOUS

## 2021-03-14 MED ORDER — MIDAZOLAM HCL 5 MG/5ML IJ SOLN
INTRAMUSCULAR | Status: DC | PRN
  Administered 2021-03-14: 2 mg via INTRAVENOUS

## 2021-03-14 MED ORDER — ACETAMINOPHEN 500 MG PO TABS
ORAL_TABLET | ORAL | Status: AC
  Filled 2021-03-14: qty 1

## 2021-03-14 MED ORDER — SODIUM CHLORIDE 0.9 % IV SOLN
2.0000 g | Freq: Once | INTRAVENOUS | Status: AC
  Administered 2021-03-14: 2 g via INTRAVENOUS
  Filled 2021-03-14: qty 2

## 2021-03-14 MED ORDER — ACETAMINOPHEN 500 MG PO TABS
1000.0000 mg | ORAL_TABLET | Freq: Once | ORAL | Status: AC
  Administered 2021-03-14: 1000 mg via ORAL

## 2021-03-14 MED ORDER — ROCURONIUM BROMIDE 10 MG/ML (PF) SYRINGE
PREFILLED_SYRINGE | INTRAVENOUS | Status: DC | PRN
  Administered 2021-03-14: 60 mg via INTRAVENOUS

## 2021-03-14 MED ORDER — STERILE WATER FOR IRRIGATION IR SOLN
Status: DC | PRN
  Administered 2021-03-14: 3 mL

## 2021-03-14 MED ORDER — GLYCOPYRROLATE PF 0.2 MG/ML IJ SOSY
PREFILLED_SYRINGE | INTRAMUSCULAR | Status: DC | PRN
  Administered 2021-03-14: .2 mg via INTRAVENOUS

## 2021-03-14 MED ORDER — PHENYLEPHRINE 40 MCG/ML (10ML) SYRINGE FOR IV PUSH (FOR BLOOD PRESSURE SUPPORT)
PREFILLED_SYRINGE | INTRAVENOUS | Status: DC | PRN
  Administered 2021-03-14: 80 ug via INTRAVENOUS

## 2021-03-14 MED ORDER — PROMETHAZINE HCL 25 MG/ML IJ SOLN: 6.2500 mg | INTRAMUSCULAR | Status: DC | PRN

## 2021-03-14 MED ORDER — DEXAMETHASONE SODIUM PHOSPHATE 4 MG/ML IJ SOLN
INTRAMUSCULAR | Status: DC | PRN
  Administered 2021-03-14: 10 mg via INTRAVENOUS

## 2021-03-14 MED ORDER — OXYCODONE HCL 5 MG/5ML PO SOLN: 5.0000 mg | Freq: Once | ORAL | Status: DC | PRN

## 2021-03-14 MED ORDER — FENTANYL CITRATE (PF) 100 MCG/2ML IJ SOLN
INTRAMUSCULAR | Status: DC | PRN
  Administered 2021-03-14: 100 ug via INTRAVENOUS

## 2021-03-14 SURGICAL SUPPLY — 39 items
BAG DRN RND TRDRP ANRFLXCHMBR (UROLOGICAL SUPPLIES) ×2
BAG URINE DRAIN 2000ML AR STRL (UROLOGICAL SUPPLIES) ×3 IMPLANT
BLADE CLIPPER SENSICLIP SURGIC (BLADE) ×3 IMPLANT
CATH FOLEY 2WAY SLVR  5CC 16FR (CATHETERS) ×6
CATH FOLEY 2WAY SLVR 5CC 16FR (CATHETERS) ×4 IMPLANT
CATH ROBINSON RED A/P 16FR (CATHETERS) IMPLANT
CATH ROBINSON RED A/P 20FR (CATHETERS) ×3 IMPLANT
CLOTH BEACON ORANGE TIMEOUT ST (SAFETY) ×3 IMPLANT
COVER BACK TABLE 60X90IN (DRAPES) ×3 IMPLANT
COVER MAYO STAND STRL (DRAPES) ×3 IMPLANT
DRSG TEGADERM 4X4.75 (GAUZE/BANDAGES/DRESSINGS) ×3 IMPLANT
DRSG TEGADERM 8X12 (GAUZE/BANDAGES/DRESSINGS) ×3 IMPLANT
GEL ULTRASOUND 20GR AQUASONIC (MISCELLANEOUS) ×3 IMPLANT
GLOVE SURG ENC MOIS LTX SZ6.5 (GLOVE) ×3 IMPLANT
GLOVE SURG ENC MOIS LTX SZ7.5 (GLOVE) ×3 IMPLANT
GLOVE SURG ORTHO LTX SZ8.5 (GLOVE) ×6 IMPLANT
GLOVE SURG UNDER POLY LF SZ6.5 (GLOVE) ×3 IMPLANT
GOWN STRL REUS W/TWL XL LVL3 (GOWN DISPOSABLE) ×3 IMPLANT
GRID BRACH TEMP 18GA 2.8X3X.75 (MISCELLANEOUS) ×3 IMPLANT
HOLDER FOLEY CATH W/STRAP (MISCELLANEOUS) ×3 IMPLANT
IMPL SPACEOAR VUE SYSTEM (Spacer) ×2 IMPLANT
IMPLANT SPACEOAR VUE SYSTEM (Spacer) ×3 IMPLANT
IV NS 1000ML (IV SOLUTION) ×3
IV NS 1000ML BAXH (IV SOLUTION) ×2 IMPLANT
KIT TURNOVER CYSTO (KITS) ×3 IMPLANT
NEEDLE BRACHY 18G 5PK (NEEDLE) ×12 IMPLANT
NEEDLE BRACHY 18G SINGLE (NEEDLE) IMPLANT
NEEDLE PK MORGANSTERN STABILIZ (NEEDLE) ×3 IMPLANT
PACK CYSTO (CUSTOM PROCEDURE TRAY) ×3 IMPLANT
SHEATH ULTRASOUND LF (SHEATH) IMPLANT
SHEATH ULTRASOUND LTX NONSTRL (SHEATH) IMPLANT
SURGILUBE 2OZ TUBE FLIPTOP (MISCELLANEOUS) ×3 IMPLANT
SUT BONE WAX W31G (SUTURE) IMPLANT
SYR 10ML LL (SYRINGE) ×6 IMPLANT
SYR 20ML LL LF (SYRINGE) IMPLANT
TOWEL OR 17X26 10 PK STRL BLUE (TOWEL DISPOSABLE) ×3 IMPLANT
UNDERPAD 30X36 HEAVY ABSORB (UNDERPADS AND DIAPERS) ×6 IMPLANT
WATER STERILE IRR 500ML POUR (IV SOLUTION) ×3 IMPLANT
quick link cartridges with brachysource I-125 seed ×225 IMPLANT

## 2021-03-14 NOTE — Anesthesia Preprocedure Evaluation (Addendum)
Anesthesia Evaluation  Patient identified by MRN, date of birth, ID band Patient awake    Reviewed: Allergy & Precautions, NPO status , Patient's Chart, lab work & pertinent test results  History of Anesthesia Complications Negative for: history of anesthetic complications  Airway Mallampati: II  TM Distance: >3 FB Neck ROM: Full    Dental  (+) Dental Advisory Given, Teeth Intact   Pulmonary Current Smoker and Patient abstained from smoking.,    Pulmonary exam normal        Cardiovascular hypertension, Pt. on medications Normal cardiovascular exam     Neuro/Psych negative neurological ROS  negative psych ROS   GI/Hepatic negative GI ROS, Neg liver ROS,   Endo/Other   Obesity   Renal/GU Renal InsufficiencyRenal disease    Prostate cancer     Musculoskeletal negative musculoskeletal ROS (+)   Abdominal   Peds  Hematology negative hematology ROS (+)   Anesthesia Other Findings   Reproductive/Obstetrics                            Anesthesia Physical Anesthesia Plan  ASA: 3  Anesthesia Plan: General   Post-op Pain Management: Tylenol PO (pre-op)   Induction: Intravenous  PONV Risk Score and Plan: 3 and Treatment may vary due to age or medical condition, Ondansetron, Midazolam and Dexamethasone  Airway Management Planned: Oral ETT  Additional Equipment: None  Intra-op Plan:   Post-operative Plan: Extubation in OR  Informed Consent: I have reviewed the patients History and Physical, chart, labs and discussed the procedure including the risks, benefits and alternatives for the proposed anesthesia with the patient or authorized representative who has indicated his/her understanding and acceptance.     Dental advisory given  Plan Discussed with: CRNA and Anesthesiologist  Anesthesia Plan Comments:        Anesthesia Quick Evaluation

## 2021-03-14 NOTE — Progress Notes (Signed)
  Radiation Oncology         [phone]) [phone] ________________________________  Name: Wayne Mills MRN: [id number]  Date: 03/14/2021  DOB: March 15, 1966       Prostate Seed Implant  EX:HBZJIR, [facility]  No ref. provider found  DIAGNOSIS:  55 y.o. gentleman with Stage T1c adenocarcinoma of the prostate with Gleason score of 3+4, and PSA of 5.25. Oncology History  Malignant neoplasm of prostate ([place])  11/11/2020 Cancer Staging   Staging form: Prostate, AJCC 8th Edition - Clinical stage from 11/11/2020: Stage IIB (cT1c, cN0, cM0, PSA: 5.3, Grade Group: 2) - Signed by Freeman Caldron, PA-C on 12/13/2020 Histopathologic type: Adenocarcinoma, NOS Stage prefix: Initial diagnosis Prostate specific antigen (PSA) range: Less than 10 Gleason primary pattern: 3 Gleason secondary pattern: 4 Gleason score: 7 Histologic grading system: 5 grade system Number of biopsy cores examined: 12 Number of biopsy cores positive: 1 Location of positive needle core biopsies: One side    12/13/2020 Initial Diagnosis   Malignant neoplasm of prostate ([place])     No diagnosis found.  PROCEDURE: Insertion of radioactive I-125 seeds into the prostate gland.  RADIATION DOSE: 145 Gy, definitive therapy.  TECHNIQUE: ANDREU DRUDGE was brought to the operating room with the urologist. He was placed in the dorsolithotomy position. He was catheterized and a rectal tube was inserted. The perineum was shaved, prepped and draped. The ultrasound probe was then introduced into the rectum to see the prostate gland.  TREATMENT DEVICE: A needle grid was attached to the ultrasound probe stand and anchor needles were placed.  3D PLANNING: The prostate was imaged in 3D using a sagittal sweep of the prostate probe. These images were transferred to the planning computer. There, the prostate, urethra and rectum were defined on each axial reconstructed image. Then, the software created an optimized 3D plan and a few seed  positions were adjusted. The quality of the plan was reviewed using [facility] information for the target and the following two organs at risk:  Urethra and Rectum.  Then the accepted plan was printed and handed off to the radiation therapist.  Under my supervision, the custom loading of the seeds and spacers was carried out and loaded into sealed vicryl sleeves.  These pre-loaded needles were then placed into the needle holder.Marland Kitchen  PROSTATE VOLUME STUDY:  Using transrectal ultrasound the volume of the prostate was verified to be 40.4 cc.  SPECIAL TREATMENT PROCEDURE/SUPERVISION AND HANDLING: The pre-loaded needles were then delivered under sagittal guidance. A total of 19 needles were used to deposit 75 seeds in the prostate gland. The individual seed activity was 0.377 mCi.  SpaceOAR:  Yes  COMPLEX SIMULATION: At the end of the procedure, an anterior radiograph of the pelvis was obtained to document seed positioning and count. Cystoscopy was performed to check the urethra and bladder.  MICRODOSIMETRY: At the end of the procedure, the patient was emitting 0.089 mR/hr at 1 meter. Accordingly, he was considered safe for hospital discharge.  PLAN: The patient will return to the radiation oncology clinic for post implant CT dosimetry in three weeks.   ________________________________  Sheral Apley Tammi Klippel, M.D.

## 2021-03-14 NOTE — Op Note (Signed)
Preoperative diagnosis: Clinically localized adenocarcinoma of the prostate (stage T1c)  Postoperative diagnosis: Clinically localized adenocarcinoma of the prostate  Procedure: 1) Transperineal placement of radioactive seeds into the prostate                    2) Cystoscopy                    3) Insertion of SpaceOAR hydrogel   Surgeon: Dr Harold Barban  Radiation oncologist: Dr. Tammi Klippel  Anesthesia: General  EBL: Minimal  Complications: None  Indication: Wayne Mills is a 55 y.o. gentleman with clinically localized prostate cancer. After discussing management options for treatment, he elected to proceed with radiotherapy. He presents today for the above procedures. The potential risks, complications, alternative options, and expected recovery course have been discussed in detail with the patient and he has provided informed consent to proceed.  Description of procedure: The patient was taken to the operating room and general anesthesia was induced. He was administered preoperative antibiotics, placed in the dorsal lithotomy position, and prepped and draped in the usual sterile fashion. Next, intraoperative transrectal ultrasonography was utilized for real-time intraoperative planning by the radiation oncology team. Once the treatment plan was completed and the seed strands created, stranded iodine 125 radiation seeds were placed utilizing a brachytherapy perineal template. 72 radioactive iodine 125 seeds into the prostate through 19 catheter needles.  The brachytherapy template was then removed.  A site in the midline was selected on the perineum for placement of an 18 g needle with saline.  The needle was advanced above the rectum and below Denonvillier's fascia to the mid gland and confirmed to be in the midline on transverse imaging.  One cc of saline was injected confirming appropriate expansion of this space.  A total of 5 cc of saline was then injected to open the space further  bilaterally.  The saline syringe was then removed and the SpaceOAR hydrogel was injected with good distribution bilaterally. Position of the radiation seeds was confirmed on fluoroscopic imaging.  Flexible cystoscopy was then performed and no seeds were identified within the bladder.  No bladder tumors, stones, or other mucosal pathology was identified within the bladder. He tolerated the procedure well and without complications. He was able to be transferred to the recovery unit in satisfactory condition.  He was given a voiding trial in the PACU.

## 2021-03-14 NOTE — Op Note (Deleted)
Preoperative diagnosis:  1.  Recurrent bladder cancer  Postoperative diagnosis: 1.  Same  Procedure(s): 1.  Cystoscopy, transurethral section of bladder tumor (small), bilateral retrograde pyelogram with intraoperative interpretation  Surgeon: Dr. Harold Barban  Anesthesia: General  Complications: None  EBL: Minimal  Specimens: Bladder tumor  Disposition of specimens: To pathology  Intraoperative findings: Approximate 1 cm exophytic papillary tumor superior and lateral to the right ureteral orifice no other recurrent lesions noted.  Tumor removed completely and base fulgurated.  Bilateral retrogrades were normal  Indication: 55 year old white male with history of superficial transitional cell carcinoma the bladder.  Was found on recent surveillance cystoscopy to have recurrent small exophytic tumor just superior and lateral to the right ureteral orifice presents this time undergo cystoscopy TURBT and retrogrades.  Description of procedure:  After obtaining informed consent for the patient was taken major cystoscopy suite placed under general anesthesia.  Placed in dorsolithotomy position genitalia prepped and draped in usual sterile fashion.  Proper pause timeout was performed for the procedure.  35 [place] the scope was advanced into the urethra and into the bladder.  Prostatic urethra.  Normal with minimal hypertrophy.  Bladder was inspected with above-noted findings.  In order to preserve pathologic integrity it was felt best to remove the tumor utilizing rigid biopsy forceps rather than doing loop resection.  Utilizing the rigid biopsy forceps the lesion was completely removed and sent to pathology for evaluation.  The base was subsequently cauterized utilizing the Bugbee electrode with good hemostasis noted.  The tumor did not encroach on the ureteral orifice.  8 [place] cone-tip catheter was then utilized to perform retrograde pyelograms which showed normal filling of both upper  tracts without evidence of filling defect or hydronephrosis.  Both upper tracts emptied out promptly upon removal of the retrograde catheter.  Bladder was emptied the area reinspected with good hemostasis noted.  Procedure was terminated he was awakened from anesthesia and taken back to the recovery room in stable condition.  No immediate complication from the procedure.

## 2021-03-14 NOTE — Interval H&P Note (Signed)
History and Physical Interval Note:  03/14/2021 12:23 PM  Wayne Mills  has presented today for surgery, with the diagnosis of PROSTATE CANCER.  The various methods of treatment have been discussed with the patient and family. After consideration of risks, benefits and other options for treatment, the patient has consented to  Procedure(s) with comments: RADIOACTIVE SEED IMPLANT/BRACHYTHERAPY IMPLANT (N/A) - 90 MINS SPACE OAR INSTILLATION (N/A) as a surgical intervention.  The patient's history has been reviewed, patient examined, no change in status, stable for surgery.  I have reviewed the patient's chart and labs.  Questions were answered to the patient's satisfaction.     Remi Haggard

## 2021-03-14 NOTE — Anesthesia Procedure Notes (Signed)
Procedure Name: Intubation Date/Time: 03/14/2021 1:42 PM Performed by: Eulas Post, Korissa Horsford W, CRNA Pre-anesthesia Checklist: Patient identified, Emergency Drugs available, Suction available and Patient being monitored Patient Re-evaluated:Patient Re-evaluated prior to induction Oxygen Delivery Method: Circle system utilized Preoxygenation: Pre-oxygenation with 100% oxygen Induction Type: IV induction Ventilation: Mask ventilation without difficulty Laryngoscope Size: Miller and 2 Grade View: Grade I Tube type: Oral Tube size: 7.5 mm Number of attempts: 1 Airway Equipment and Method: Stylet and Oral airway Placement Confirmation: ETT inserted through vocal cords under direct vision, positive ETCO2 and breath sounds checked- equal and bilateral Secured at: 24 cm Tube secured with: Tape Dental Injury: Teeth and Oropharynx as per pre-operative assessment

## 2021-03-14 NOTE — Transfer of Care (Signed)
Immediate Anesthesia Transfer of Care Note  Patient: Wayne Mills  Procedure(s) Performed: RADIOACTIVE SEED IMPLANT/BRACHYTHERAPY IMPLANT (Prostate) SPACE OAR INSTILLATION (Perineum)  Patient Location: PACU  Anesthesia Type:General  Level of Consciousness: awake and alert   Airway & Oxygen Therapy: Patient Spontanous Breathing and Patient connected to face mask oxygen  Post-op Assessment: Report given to RN and Post -op Vital signs reviewed and stable  Post vital signs: Reviewed and stable  Last Vitals:  Vitals Value Taken Time  BP 124/91 03/14/21 1457  Temp 36.4 C 03/14/21 1458  Pulse 65 03/14/21 1500  Resp 12 03/14/21 1500  SpO2 99 % 03/14/21 1500  Vitals shown include unvalidated device data.  Last Pain:  Vitals:   03/14/21 1214  TempSrc: Oral  PainSc: 0-No pain      Patients Stated Pain Goal: 4 (05/67/88 9338)  Complications: No notable events documented.

## 2021-03-14 NOTE — Discharge Instructions (Addendum)
  Radioactive Seed Implant Home Care Instructions   Activity:    Rest for the remainder of the day.  Do not drive or operate equipment today.  You may resume normal  activities in a few days as instructed by your physician, without risk of harmful radiation exposure to those around you, provided you follow the time and distance precautions on the Radiation Oncology Instruction Sheet.   Meals: Drink plenty of lipuids and eat light foods, such as gelatin or soup this evening .  You may return to normal meal plan tomorrow.  Return To Work: You may return to work as instructed by Naval architect.  Special Instruction:   If any seeds are found, use tweezers to pick up seeds and place in a glass container of any kind and bring to your physician's office.  Call your physician if any of these symptoms occur:  Persistent or heavy bleeding Urine stream diminishes or stops completely after catheter is removed Fever equal to or greater than 101 degrees F Cloudy urine with a strong foul odor Severe pain  You may feel some burning pain and/or hesitancy when you urinate after the catheter is removed.  These symptoms may increase over the next few weeks, but should diminish within forur to six weeks.  Applying moist heat to the lower abdomen or a hot tub bath may help relieve the pain.  If the discomfort becomes severe, please call your physician for additional medications.      Post Anesthesia Home Care Instructions  Activity: Get plenty of rest for the remainder of the day. A responsible individual must stay with you for 24 hours following the procedure.  For the next 24 hours, DO NOT: -Drive a car -Paediatric nurse -Drink alcoholic beverages -Take any medication unless instructed by your physician -Make any legal decisions or sign important papers.  Meals: Start with liquid foods such as gelatin or soup. Progress to regular foods as tolerated. Avoid greasy, spicy, heavy foods. If nausea  and/or vomiting occur, drink only clear liquids until the nausea and/or vomiting subsides. Call your physician if vomiting continues.  Special Instructions/Symptoms: Your throat may feel dry or sore from the anesthesia or the breathing tube placed in your throat during surgery. If this causes discomfort, gargle with warm salt water. The discomfort should disappear within 24 hours.  If you had a scopolamine patch placed behind your ear for the management of post- operative nausea and/or vomiting:  1. The medication in the patch is effective for 72 hours, after which it should be removed.  Wrap patch in a tissue and discard in the trash. Wash hands thoroughly with soap and water. 2. You may remove the patch earlier than 72 hours if you experience unpleasant side effects which may include dry mouth, dizziness or visual disturbances. 3. Avoid touching the patch. Wash your hands with soap and water after contact with the patch.     Do not take any Tylenol until after 6:00 pm today.

## 2021-03-15 ENCOUNTER — Encounter (HOSPITAL_BASED_OUTPATIENT_CLINIC_OR_DEPARTMENT_OTHER): Payer: Self-pay | Admitting: Urology

## 2021-03-15 NOTE — Anesthesia Postprocedure Evaluation (Signed)
Anesthesia Post Note  Patient: Wayne Mills  Procedure(s) Performed: RADIOACTIVE SEED IMPLANT/BRACHYTHERAPY IMPLANT (Prostate) SPACE OAR INSTILLATION (Perineum)     Patient location during evaluation: PACU Anesthesia Type: General Level of consciousness: awake and alert Pain management: pain level controlled Vital Signs Assessment: post-procedure vital signs reviewed and stable Respiratory status: spontaneous breathing, nonlabored ventilation and respiratory function stable Cardiovascular status: stable and blood pressure returned to baseline Anesthetic complications: no   No notable events documented.  Last Vitals:  Vitals:   03/14/21 1530 03/14/21 1600  BP: 138/89 (!) 145/94  Pulse: (!) 57 (!) 51  Resp: 15 18  Temp:    SpO2: 97% 98%    Last Pain:  Vitals:   03/14/21 1527  TempSrc:   PainSc: [place]

## 2021-04-11 ENCOUNTER — Telehealth: Payer: Self-pay | Admitting: *Deleted

## 2021-04-11 NOTE — Telephone Encounter (Signed)
CALLED PATIENT TO REMIND OF POST SEED APPTS. FOR 04-12-21, SPOKE WITH PATIENT AND HE IS AWARE OF THESE APPTS.

## 2021-04-12 ENCOUNTER — Other Ambulatory Visit: Payer: Self-pay

## 2021-04-12 ENCOUNTER — Ambulatory Visit
Admission: RE | Admit: 2021-04-12 | Discharge: 2021-04-12 | Disposition: A | Discharge status: Home / Self Care | Payer: No Typology Code available for payment source | Source: Ambulatory Visit | Attending: Radiation Oncology | Admitting: Radiation Oncology

## 2021-04-12 ENCOUNTER — Encounter: Payer: Self-pay | Admitting: Radiation Oncology

## 2021-04-12 ENCOUNTER — Encounter: Payer: Self-pay | Admitting: Urology

## 2021-04-12 ENCOUNTER — Ambulatory Visit
Admission: RE | Admit: 2021-04-12 | Discharge: 2021-04-12 | Disposition: A | Discharge status: Home / Self Care | Payer: No Typology Code available for payment source | Source: Ambulatory Visit | Attending: Urology | Admitting: Urology

## 2021-04-12 VITALS — BP 141/97 | HR 80 | Temp 97.5°F | Resp 18 | Ht 77.0 in | Wt 278.6 lb

## 2021-04-12 DIAGNOSIS — R351 Nocturia: Secondary | ICD-10-CM | POA: Insufficient documentation

## 2021-04-12 DIAGNOSIS — C61 Malignant neoplasm of prostate: Secondary | ICD-10-CM | POA: Insufficient documentation

## 2021-04-12 DIAGNOSIS — Z79899 Other long term (current) drug therapy: Secondary | ICD-10-CM | POA: Insufficient documentation

## 2021-04-12 NOTE — Progress Notes (Signed)
Patient states some urinary stream difficulties but well managed. No other symptoms reported at this time. Meaningful use complete.  I-PSS score of 21 (severe).  Currently on Flomax 0.4mg  daily. Urology follow-up scheduled for March 2023 -per patient.  BP (!) 141/97 (BP Location: Left Arm, Patient Position: Sitting, Cuff Size: Large)    Pulse 80    Temp (!) 97.5 F (36.4 C) (Temporal)    Resp 18    Ht 6\' 5"  (1.956 m)    Wt 278 lb 9.6 oz (126.4 kg)    SpO2 100%    BMI 33.04 kg/m

## 2021-04-12 NOTE — Progress Notes (Signed)
°  Radiation Oncology         [phone] ________________________________  Name: Wayne Mills MRN: [id number]  Date: 04/12/2021  DOB: Aug 09, 1965  COMPLEX SIMULATION NOTE  NARRATIVE:  The patient was brought to the [facility] today following prostate seed implantation approximately one month ago.  Identity was confirmed.  All relevant records and images related to the planned course of therapy were reviewed.  Then, the patient was set-up supine.  CT images were obtained.  The CT images were loaded into the planning software.  Then the prostate and rectum were contoured.  Treatment planning then occurred.  The implanted iodine 125 seeds were identified by the physics staff for projection of radiation distribution  I have requested : 3D Simulation  I have requested a DVH of the following structures: Prostate and rectum.    ________________________________  Sheral Apley Tammi Klippel, M.D.

## 2021-04-12 NOTE — Progress Notes (Signed)
Radiation Oncology         [phone] ________________________________  Name: Wayne Mills MRN: [id number]  Date: 04/12/2021  DOB: 01/31/1966  Post-Seed Follow-Up Visit Note  CC: Clinic, Alda Berthold, MD  Diagnosis:   55 y.o. gentleman with Stage T1c adenocarcinoma of the prostate with Gleason score of 3+4, and PSA of 5.25.    ICD-10-CM   1. Malignant neoplasm of prostate ([place])  C61       Interval Since Last Radiation:  4 weeks 03/14/21:  Insertion of radioactive I-125 seeds into the prostate gland; 145 Gy, definitive therapy with placement of SpaceOAR gel.  Narrative:  The patient returns today for routine follow-up.  He is complaining of increased urinary frequency and urinary hesitation symptoms. He filled out a questionnaire regarding urinary function today providing and overall IPSS score of 21 characterizing his symptoms as severe with nocturia x3, urgency, frequency, intermittency, weak flow of stream and hesitancy.  He has continued taking Flomax as prescribed and does feel like his symptoms are gradually improving.  He specifically denies gross hematuria, dysuria or incontinence.  His pre-implant score was 15. He denies any abdominal pain or bowel symptoms.  He reports a healthy appetite and is maintaining his weight.  He did notice a mild decrease in his energy level for the first couple of weeks following his procedure but this has since resolved and he feels like he is back to his baseline.  Overall, he is quite pleased with his progress to date.  ALLERGIES:  is allergic to penicillins.  Meds: Current Outpatient Medications  Medication Sig Dispense Refill   amLODipine (NORVASC) 10 MG tablet Take 10 mg by mouth daily.     clobetasol cream (TEMOVATE) 0.05 % APPLY SMALL AMOUNT TO AFFECTED AREA TWICE A DAY     lisinopril (ZESTRIL) 40 MG tablet Take 40 mg by mouth daily.     tamsulosin (FLOMAX) 0.4 MG CAPS capsule Take 0.4 mg by mouth.     traMADol  (ULTRAM) 50 MG tablet Take 1 tablet (50 mg total) by mouth every 6 (six) hours as needed. 20 tablet 0   No current facility-administered medications for this visit.    Physical Findings: In general this is a well appearing African-American male in no acute distress. He's alert and oriented x4 and appropriate throughout the examination. Cardiopulmonary assessment is negative for acute distress and he exhibits normal effort.   Lab Findings: Lab Results  Component Value Date   WBC 3.6 (L) 03/07/2021   HGB 15.1 03/07/2021   HCT 45.9 03/07/2021   MCV 91.8 03/07/2021   PLT 200 03/07/2021    Radiographic Findings:  Patient underwent CT imaging in our clinic for post implant dosimetry. The CT will be reviewed by Dr. Tammi Klippel to confirm there is an adequate distribution of radioactive seeds throughout the prostate gland and ensure that there are no seeds in or near the rectum.  We suspect the final radiation plan and dosimetry will show appropriate coverage of the prostate gland. He understands that we will call and inform him of any unexpected findings on further review of his imaging and dosimetry.  Impression/Plan: 55 y.o. gentleman with Stage T1c adenocarcinoma of the prostate with Gleason score of 3+4, and PSA of 5.25. The patient is recovering from the effects of radiation. His urinary symptoms should gradually improve over the next 4-6 months. We talked about this today. He is encouraged by his improvement already and is otherwise pleased with  his outcome. We also talked about long-term follow-up for prostate cancer following seed implant. He understands that ongoing PSA determinations and digital rectal exams will help perform surveillance to rule out disease recurrence. He was seen in follow-up with Dr. Milford Cage on 04/06/2021 and is scheduled for his next follow-up visit in March 2023 for his initial posttreatment PSA. He understands what to expect with his PSA measures. Patient was also educated  today about some of the long-term effects from radiation including a small risk for rectal bleeding and possibly erectile dysfunction. We talked about some of the general management approaches to these potential complications. However, I did encourage the patient to contact our office or return at any point if he has questions or concerns related to his previous radiation and prostate cancer.    Nicholos Johns, PA-C

## 2021-04-17 NOTE — Progress Notes (Signed)
°  Radiation Oncology         [phone] ________________________________  Name: Wayne Mills MRN: [id number]  Date: 04/12/2021  DOB: [birth year]  3D Planning Note   Prostate Brachytherapy Post-Implant Dosimetry  Diagnosis: 56 y.o. gentleman with Stage T1c adenocarcinoma of the prostate with Gleason score of 3+4, and PSA of 5.25.  Narrative: On a previous date, [name] returned following prostate seed implantation for post implant planning. He underwent CT scan complex simulation to delineate the three-dimensional structures of the pelvis and demonstrate the radiation distribution.  Since that time, the seed localization, and complex isodose planning with dose volume histograms have now been completed.  Results:   Prostate Coverage - The dose of radiation delivered to the 90% or more of the prostate gland (D90) was 114.94% of the prescription dose. This exceeds our goal of greater than 90%. Rectal Sparing - The volume of rectal tissue receiving the prescription dose or higher was 0.0 cc. This falls under our thresholds tolerance of 1.0 cc.  Impression: The prostate seed implant appears to show adequate target coverage and appropriate rectal sparing.  Plan:  The patient will continue to follow with urology for ongoing PSA determinations. I would anticipate a high likelihood for local tumor control with minimal risk for rectal morbidity.  ________________________________  Sheral Apley Tammi Klippel, M.D.

## 2021-05-12 ENCOUNTER — Telehealth: Payer: Self-pay | Admitting: *Deleted

## 2021-05-22 ENCOUNTER — Telehealth: Payer: Self-pay | Admitting: *Deleted

## 2021-06-06 ENCOUNTER — Telehealth: Payer: Self-pay | Admitting: *Deleted

## 2021-06-15 ENCOUNTER — Telehealth: Payer: Self-pay | Admitting: *Deleted

## 2021-06-22 ENCOUNTER — Telehealth: Payer: Self-pay | Admitting: *Deleted

## 2021-06-29 ENCOUNTER — Other Ambulatory Visit: Payer: Self-pay

## 2021-06-29 ENCOUNTER — Inpatient Hospital Stay: Payer: No Typology Code available for payment source | Attending: Adult Health | Admitting: Adult Health

## 2021-06-29 VITALS — BP 136/91 | HR 55 | Temp 97.7°F | Resp 18 | Ht 77.0 in | Wt 273.6 lb

## 2021-06-29 DIAGNOSIS — I10 Essential (primary) hypertension: Secondary | ICD-10-CM | POA: Insufficient documentation

## 2021-06-29 DIAGNOSIS — F17210 Nicotine dependence, cigarettes, uncomplicated: Secondary | ICD-10-CM | POA: Insufficient documentation

## 2021-06-29 DIAGNOSIS — N529 Male erectile dysfunction, unspecified: Secondary | ICD-10-CM | POA: Insufficient documentation

## 2021-06-29 DIAGNOSIS — C61 Malignant neoplasm of prostate: Secondary | ICD-10-CM | POA: Diagnosis not present

## 2021-06-29 NOTE — Progress Notes (Signed)
SURVIVORSHIP VISIT: ? ? ? ?BRIEF ONCOLOGIC HISTORY:  ?Oncology History  ?Malignant neoplasm of prostate ([place])  ?11/11/2020 Cancer Staging  ? Staging form: Prostate, AJCC 8th Edition ?- Clinical stage from 11/11/2020: Stage IIB (cT1c, cN0, cM0, PSA: 5.3, Grade Group: 2) - Signed by Freeman Caldron, PA-C on 12/13/2020 ?Histopathologic type: Adenocarcinoma, NOS ?Stage prefix: Initial diagnosis ?Prostate specific antigen (PSA) range: Less than 10 ?Gleason primary pattern: 3 ?Gleason secondary pattern: 4 ?Gleason score: 7 ?Histologic grading system: 5 grade system ?Number of biopsy cores examined: 12 ?Number of biopsy cores positive: 1 ?Location of positive needle core biopsies: One side ? ?  ?12/13/2020 Initial Diagnosis  ? Malignant neoplasm of prostate ([place]) ?  ? ? ?INTERVAL HISTORY:  ?Mr. Hutzler to review his survivorship care plan detailing her treatment course for prostate cancer, as well as monitoring long-term side effects of that treatment, education regarding health maintenance, screening, and overall wellness and health promotion.    ? ?Overall, Mr. Tucholski reports feeling quite well.  He had been experiencing increased urinary stream difficulties, however he is taking flomax and notes this is improved.  He says that he has difficulty maintaining an erection at times as well.   ? ?REVIEW OF SYSTEMS:  ?Review of Systems  ?Constitutional:  Negative for appetite change, chills, fatigue, fever and unexpected weight change.  ?HENT:   Negative for hearing loss, lump/mass and trouble swallowing.   ?Eyes:  Negative for eye problems and icterus.  ?Respiratory:  Negative for chest tightness, cough and shortness of breath.   ?Cardiovascular:  Negative for chest pain, leg swelling and palpitations.  ?Gastrointestinal:  Negative for abdominal distention, abdominal pain, constipation, diarrhea, nausea and vomiting.  ?Endocrine: Negative for hot flashes.  ?Genitourinary:  Negative for difficulty urinating.   ?Musculoskeletal:   Negative for arthralgias.  ?Skin:  Negative for itching and rash.  ?Neurological:  Negative for dizziness, extremity weakness, headaches and numbness.  ?Hematological:  Negative for adenopathy. Does not bruise/bleed easily.  ?Psychiatric/Behavioral:  Negative for depression. The patient is not nervous/anxious.   ? ? ? ? ? ?ONCOLOGY TREATMENT TEAM:  ?1.  Urologist:  Dr. Milford Cage at [facility] ?2. Radiation Oncologist: Dr. Tammi Klippel ?  ? ?PAST MEDICAL/SURGICAL HISTORY:  ?Past Medical History:  ?Diagnosis Date  ? Cancer [facility])   ? Hypertension   ? ?Past Surgical History:  ?Procedure Laterality Date  ? RADIOACTIVE SEED IMPLANT N/A 03/14/2021  ? Procedure: RADIOACTIVE SEED IMPLANT/BRACHYTHERAPY IMPLANT;  Surgeon: Remi Haggard, MD;  Location: [facility];  Service: Urology;  Laterality: N/A;  90 MINS  ? SPACE OAR INSTILLATION N/A 03/14/2021  ? Procedure: SPACE OAR INSTILLATION;  Surgeon: Remi Haggard, MD;  Location: [facility];  Service: Urology;  Laterality: N/A;  ? TONSILLECTOMY    ? ? ? ?ALLERGIES:  ?Allergies  ?Allergen Reactions  ? Penicillins Nausea And Vomiting and Rash  ?  REACTION: told as child  ? ? ? ?CURRENT MEDICATIONS:  ?Outpatient Encounter Medications as of 06/29/2021  ?Medication Sig  ? amLODipine (NORVASC) 10 MG tablet Take 10 mg by mouth daily.  ? aspirin EC 81 MG tablet Take 81 mg by mouth daily. Swallow whole.  ? clobetasol cream (TEMOVATE) 0.05 % APPLY SMALL AMOUNT TO AFFECTED AREA TWICE A DAY  ? lisinopril (ZESTRIL) 40 MG tablet Take 40 mg by mouth daily.  ? tamsulosin (FLOMAX) 0.4 MG CAPS capsule Take 0.4 mg by mouth 2 (two) times daily.  ? [DISCONTINUED] traMADol (ULTRAM) 50 MG tablet  Take 1 tablet (50 mg total) by mouth every 6 (six) hours as needed. (Patient not taking: Reported on 06/29/2021)  ? ?No facility-administered encounter medications on file as of 06/29/2021.  ? ? ? ?ONCOLOGIC FAMILY HISTORY:  ?Family History  ?Problem Relation Age of Onset  ?  Hypertension Mother   ? Hypertension Father   ? ? ? ?SOCIAL HISTORY:  ?Social History  ? ?Socioeconomic History  ? Marital status: Married  ?  Spouse name: Not on file  ? Number of children: Not on file  ? Years of education: Not on file  ? Highest education level: Not on file  ?Occupational History  ? Not on file  ?Tobacco Use  ? Smoking status: Every Day  ?  Packs/day: 1.00  ?  Types: Cigars, Cigarettes  ? Smokeless tobacco: Never  ? Tobacco comments:  ?  1-2 cigars daily  ?Vaping Use  ? Vaping Use: Never used  ?Substance and Sexual Activity  ? Alcohol use: Yes  ?  Comment: occasiona'  ? Drug use: No  ? Sexual activity: Yes  ?Other Topics Concern  ? Not on file  ?Social History Narrative  ? Not on file  ? ?Social Determinants of Health  ? ?Financial Resource Strain: Not on file  ?Food Insecurity: Not on file  ?Transportation Needs: Not on file  ?Physical Activity: Not on file  ?Stress: Not on file  ?Social Connections: Not on file  ?Intimate Partner Violence: Not on file  ? ? ? ?OBSERVATIONS/OBJECTIVE:  ?BP (!) 136/91 (BP Location: Left Arm, Patient Position: Sitting)   Pulse (!) 55   Temp 97.7 ?F (36.5 ?C) (Tympanic)   Resp 18   Ht '6\' 5"'$  (1.956 m)   Wt 273 lb 9.6 oz (124.1 kg)   SpO2 97%   BMI 32.44 kg/m?  ?GENERAL: Patient is a well appearing male in no acute distress ?HEENT:  Sclerae anicteric.   Neck is supple.  ?NODES:  No cervical, supraclavicular, or axillary lymphadenopathy palpated.  ?LUNGS:  Clear to auscultation bilaterally.  No wheezes or rhonchi. ?HEART:  Regular rate and rhythm. No murmur appreciated. ?ABDOMEN:  Soft, nontender.  Positive, normoactive bowel sounds. No organomegaly palpated. ?MSK:  No focal spinal tenderness to palpation. Full range of motion bilaterally in the upper extremities. ?EXTREMITIES:  No peripheral edema.   ?SKIN:  Clear with no obvious rashes or skin changes. No nail dyscrasia. ?NEURO:  Nonfocal. Well oriented.  Appropriate affect. ? ?LABORATORY DATA:  ?None for this  visit. ? ?DIAGNOSTIC IMAGING:  ?None for this visit.  ? ?  ? ?ASSESSMENT AND PLAN:  ?Mr.Wayne Mills is a pleasant 56 y.o.  He presents to the Survivorship Clinic for our initial meeting and routine follow-up post-completion of treatment for prostate cancer ? ? ?1. Stage IIB prostate cancer.  Giovannie is doing well today.  He will f/u with Dr. Milford Cage next week to repeat PSA and f/u and begin his prostate cancer surveillance.  Today, a comprehensive survivorship care plan and treatment summary was reviewed with the patient today detailing his prostate cancer diagnosis, treatment course, potential late/long-term effects of treatment, appropriate follow-up care with recommendations for the future, and patient education resources.  A copy of this summary, along with a letter will be sent to the patient?s primary care provider via mail/fax/In Basket message after today?s visit.   ? ?2. Urinary stream changes & erectile dysfunction:  I recommended he f/u with Dr. Milford Cage about which ED medication he prefers.  He was recommended  to continue flomax.  He is tolerating this well.   ? ?3. Bone health: He was given education on specific activities to promote bone health. ? ?4. Cancer screening:  Due to Mr. Kurtenbach's history and his age, he should receive screening for skin cancers, colon cancer.  The information and recommendations are listed on the patient's comprehensive care plan/treatment summary and were reviewed in detail with the patient.   ? ?5. Health maintenance and wellness promotion: Mr. Pickard was encouraged to consume 5-7 servings of fruits and vegetables per day. We reviewed the "Nutrition Rainbow" handout.  He was also encouraged to engage in moderate to vigorous exercise for 30 minutes per day most days of the week. We discussed the [employer] fitness program, which is designed for cancer survivors to help them become more physically fit after cancer treatments.  He was instructed to limit his alcohol  consumption and continue to abstain from tobacco use.   ?  ?6. Support services/counseling: It is not uncommon for this period of the patient's cancer care trajectory to be one of many emotions and stressors.  H

## 2021-06-30 ENCOUNTER — Encounter: Payer: Self-pay | Admitting: Adult Health

## 2021-09-28 ENCOUNTER — Encounter: Payer: Self-pay | Admitting: Internal Medicine

## 2021-09-28 ENCOUNTER — Ambulatory Visit (INDEPENDENT_AMBULATORY_CARE_PROVIDER_SITE_OTHER): Payer: No Typology Code available for payment source | Admitting: Internal Medicine

## 2021-09-28 VITALS — BP 130/90 | HR 66 | Temp 98.0°F | Ht 77.0 in | Wt 261.0 lb

## 2021-09-28 DIAGNOSIS — D869 Sarcoidosis, unspecified: Secondary | ICD-10-CM

## 2021-09-28 NOTE — Patient Instructions (Addendum)
Please schedule follow up scheduled with myself in 1 year.  If my schedule is not open yet, we will contact you with a reminder closer to that time. Please call [phone] if you haven't heard from [place] a month before.   Before your next visit I would like you to have:  Full set of PFTs - in the next couple weeks.   Please cut back on cigars! They can cause head, neck, throat, tongue cancer - no bueno!  Follow up with kidney doctor. Your other labs look great for sarcoidosis. Call me if any breathing or sarcoid related issues before we follow up again.

## 2021-09-28 NOTE — Progress Notes (Signed)
Wayne Mills    [id number]    [birth year]  Primary Care Physician:[facility]  Referring Physician: Delorise Shiner, [place],  [place] [id number] Reason for Consultation: sarcoidosis Date of Consultation: 09/28/2021  Chief complaint:   Chief Complaint  Patient presents with   Consult    Diagnosed in 2010 and has been followed yearly with cxr and breathing test.  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  He receives his care at the [place]. However diagnosed last year with prostate cancer and currently undergoing brachytherapy.   HE has been seen by dermatology and diagnosed with cutaneous sarcoidosis and was offered a trial of humira for this - at this time he wants to continue topical therapy with steroids. Has been on methotrexate in the past for a couple months, but also didn't see results.   Used to follow with Dr. Gwenette Greet at the [facility] for annual pft/check in.   No cough, chest tightness, wheezing. No recurrent pneumonia or bronchitis.  He does have seasonal allergies. Takes over the counter anti-histamine prn.  No painful vision, follows with eye doctor annually.   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  Smoking history: started smoking cigars around age 59 2-3.    Social History   Occupational History   Not on file  Tobacco Use   Smoking status: Every Day    Types: Cigars   Smokeless tobacco: Never   Tobacco comments:    1-2 cigars daily.  09/28/2021 hfb  Vaping Use   Vaping Use: Never used  Substance and Sexual Activity   Alcohol use: Yes    Comment: occasiona'   Drug use: No   Sexual activity: Yes    Relevant family history:  Family  History  Problem Relation Age of Onset   Hypertension Mother    Hypertension Father    Eczema Father    Asthma Brother    Sarcoidosis Neg Hx     Past Medical History:  Diagnosis Date   Cancer ([place])    Hypertension     Past Surgical History:  Procedure Laterality Date   RADIOACTIVE SEED IMPLANT N/A 03/14/2021   Procedure: RADIOACTIVE SEED IMPLANT/BRACHYTHERAPY IMPLANT;  Surgeon: Remi Haggard, MD;  Location: [facility];  Service: Urology;  Laterality: N/A;  90 MINS   SPACE OAR INSTILLATION N/A 03/14/2021   Procedure: SPACE OAR INSTILLATION;  Surgeon: Remi Haggard, MD;  Location: [facility];  Service: Urology;  Laterality: N/A;   TONSILLECTOMY       Physical Exam: Blood pressure 130/90, pulse 66, temperature 98 F (36.7 C), temperature source Oral, height '6\' 5"'$  (1.956 m), weight 261 lb (118.4 kg), SpO2 99 %. Gen:      No acute distress ENT:  no nasal polyps, mucus membranes moist Lungs:    No increased respiratory effort, symmetric chest wall excursion, clear to auscultation bilaterally, no wheezes or crackles CV:         Regular rate and rhythm; no murmurs, rubs, or gallops.  No pedal edema Abd:      + bowel sounds; soft, non-tender; no distension MSK: no acute synovitis of DIP  or PIP joints, no mechanics hands.  Skin:      papular rash around his eyes, nasal bridge and hairline consistent with sarcoidosis Neuro: normal speech, no focal facial asymmetry Psych: alert and oriented x3, normal mood and affect   Data Reviewed/Medical Decision Making:  Independent interpretation of tests: Imaging:  Review of patient's chest xray 01/2021 images revealed no acute process. . The patient's images have been independently reviewed by me.    PFTs: I have personally reviewed the patient's PFTs and      No data to display          Labs: Cr elevated at 1.68 Calcium wnl at 8.9 in Nov CMP wnl   Immunization status:  Immunization  History  Administered Date(s) Administered   PFIZER(Purple Top)SARS-COV-2 Vaccination 06/27/2019, 07/18/2019, 11/14/2019, 02/13/2020   Pneumococcal Conjugate-13 12/26/2013   Pneumococcal Polysaccharide-23 11/07/2012, 03/08/2018   Tdap 04/16/2010   Zoster Recombinat (Shingrix) 06/25/2020, 12/24/2020     I reviewed prior external note(s) from [facility] dermatology, oncology  I reviewed the result(s) of the labs and imaging as noted above.   I have ordered PFT   Assessment:  Cutaneous sarcoidosis HTN CKD possibly related to CKD  Plan/Recommendations:  His sarcoidosis is limited to cutaneous. Suspect his CKD is related to HTN but encouraged him to follow up with his nephrologist.  The rest of his sarcoid multi-system evaluation is ok. He just needs a PFT. Will obtain records of old PFTs from the [place].    Baseline eye examination to evaluate for ocular sarcoidosis - saw in the last year Baseline serum creatinine for renal sarcoidosis - elevated Baseline serum alkaline phosphatase for hepatic sarcoidosis - wnl Baseline serum calcium - wnl Baseline serum complete blood count - wnl Baseline EKG for cardiac sarcoidosis - NSR   Reference: Diagnosis and Detection of Sarcoidosis: An Therapist, art [address], Iss 8, pp [id number], Jul 30, 2018   We discussed disease management and progression at length today.   I spent 45 minutes in the care of this patient today including pre-charting, chart review, review of results, face-to-face care, coordination of care and communication with consultants etc.).  Return to Care: Return in about 1 year (around 09/29/2022).  Lenice Llamas, MD Pulmonary and [place]  CC: Delorise Shiner, MD

## 2022-03-10 IMAGING — DX DG CHEST 2V
2 series · 2 of 2 positions shown · non-contrast
Comparison: 09/05/2005

CLINICAL DATA: Preop for radioactive seed implant.

EXAM:
CHEST - 2 VIEW

[chest pa]
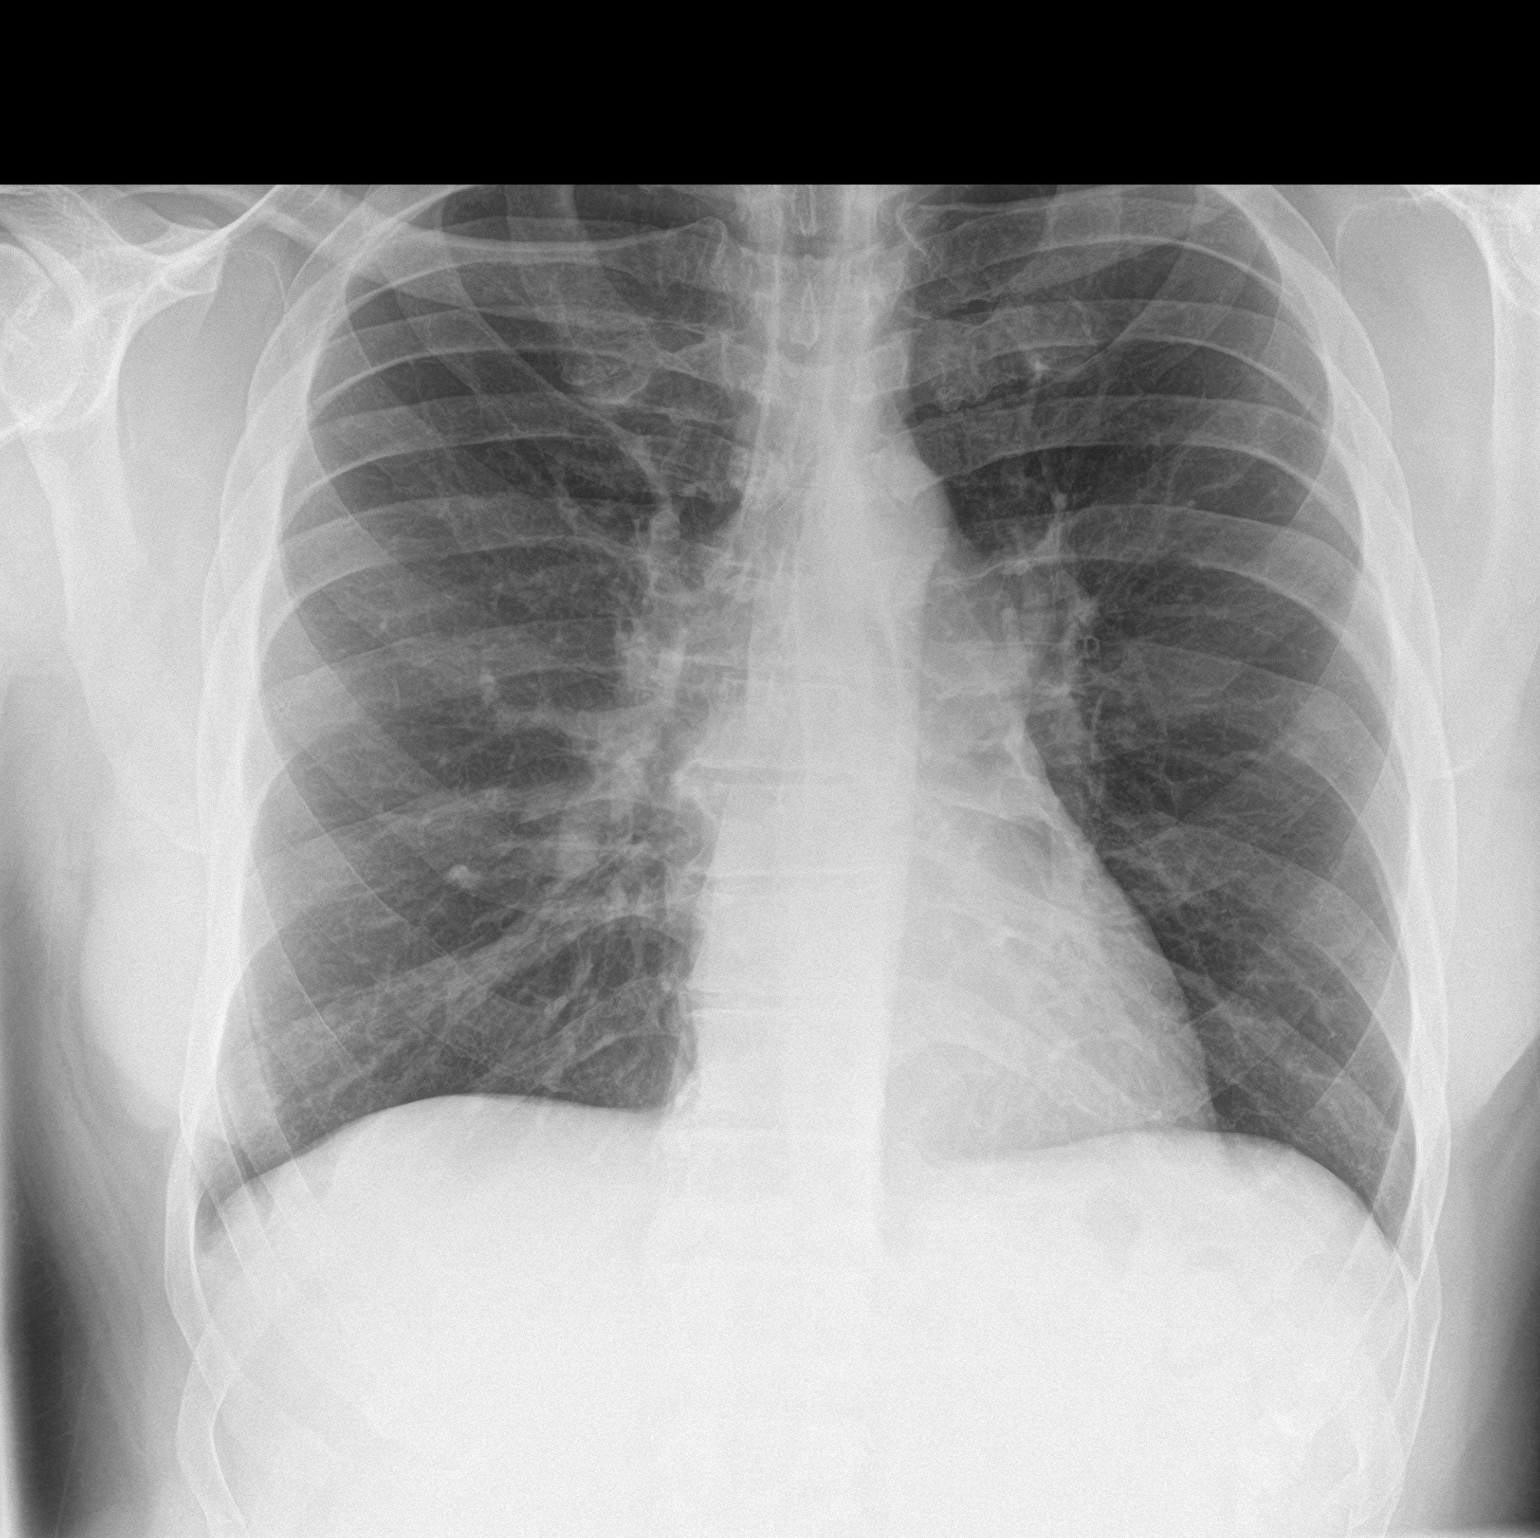

[chest lat]
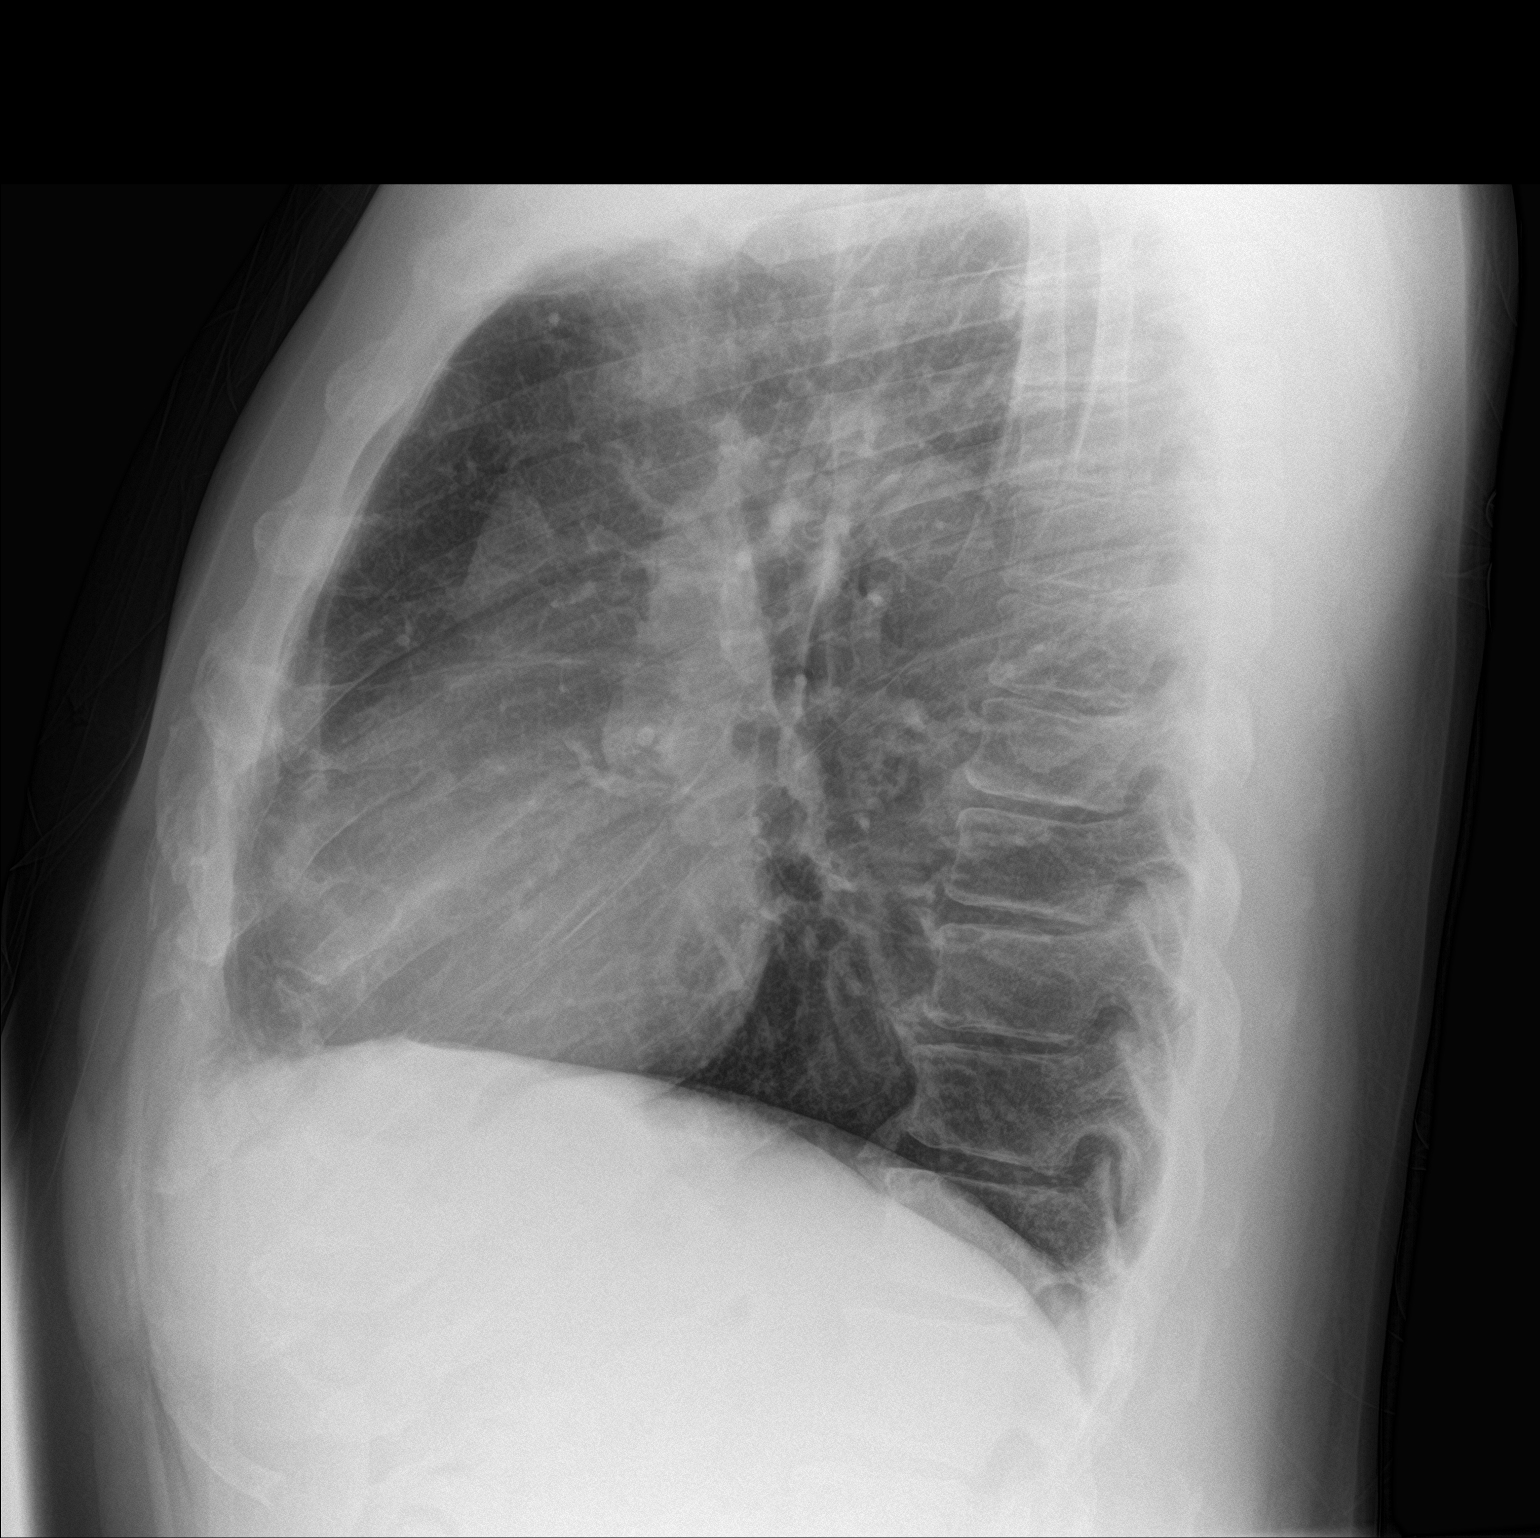

[2 of 2 positions shown; findings below may reference images not displayed]

FINDINGS: Both lungs are clear. Heart and mediastinum are within normal
limits. Trachea is midline. Negative for a pneumothorax. No acute
bone abnormality.
IMPRESSION: No active cardiopulmonary disease.

## 2023-12-12 ENCOUNTER — Ambulatory Visit (INDEPENDENT_AMBULATORY_CARE_PROVIDER_SITE_OTHER): Admitting: Gastroenterology

## 2023-12-12 ENCOUNTER — Encounter: Payer: Self-pay | Admitting: Gastroenterology

## 2023-12-12 VITALS — BP 150/88 | HR 71 | Ht 77.0 in | Wt 221.4 lb

## 2023-12-12 DIAGNOSIS — D509 Iron deficiency anemia, unspecified: Secondary | ICD-10-CM | POA: Diagnosis not present

## 2023-12-12 DIAGNOSIS — K220 Achalasia of cardia: Secondary | ICD-10-CM

## 2023-12-12 DIAGNOSIS — Z860101 Personal history of adenomatous and serrated colon polyps: Secondary | ICD-10-CM

## 2023-12-12 DIAGNOSIS — R9389 Abnormal findings on diagnostic imaging of other specified body structures: Secondary | ICD-10-CM

## 2023-12-12 NOTE — Progress Notes (Signed)
 Wayne Mills [id number] 1965-05-22   Chief Complaint: Discuss EGD  Referring Provider: [facility] Primary GI MD: Dr. Avram  HPI: Wayne Mills is a 57 y.o. male with past medical history of achalasia s/p surgery, prostate cancer 2022 s/p radiation, HTN, sarcoidosis, colon polyps, who is referred for consideration of EGD/EUS.    On CT A/P without IV contrast 07/04/2023 patient found to have a possible 4.2 cm mass posterior to the gastric cardia near the gastroesophageal junction, which could represent a gastric cardia diverticulum however gastrointestinal stromal tumors in the differential.  Repeat abdominal CT scan with contrast was recommended.  A stable 3.3 cm liver lesion was also seen but had been seen on CT scan in 2013 and deemed likely benign.  Bilateral pulmonary nodules were also seen, present on prior CT scan 2013.  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  He has been referred by the [facility] with request for EGD with EUS to further evaluate abnormal findings on imaging and rule out GIST.  On labs 08/07/2023 CBC showed hemoglobin of 14.2, hematocrit 42.4, MCV 91.  GFR 42, BUN 29, creatinine 1.85.  ALT 47, AST 51, bilirubin 0.5  Iron panel 02/19/2023 showed low iron 51, low iron saturation 17, ferritin 114  Had colonoscopy in 2021 [facility] with a TA and hyperplastic polyp, recall 5 years (family history).  Patient reports he also had a colonoscopy prior to that in 2016.   Patient states he had a CT scan in March, and has not had a repeat scan.  Denies prior EGD.  Denies nausea, vomiting, fever, chills, acid reflux, heartburn.  Has history of achalasia in the past which was surgically corrected and denies any issues with dysphagia since then.  Denies abdominal pain.  Has regular bowel movements.  Denies any blood in his stool or melena.  Takes Metamucil every day or every other  day and this keeps him regular.  Denies history of anemia, though on last visit to PCP had low iron.  He is now on a daily iron supplement.  Currently smokes 2 cigars/day.  Previous GI Procedures/Imaging   Colonoscopy 09/02/2019 [facility]) Diverticulosis Small hemorrhoids 1 hyperplastic polyp 1 tubular adenoma Recall 5 years (first-degree relative earlier than age 51)  Colonoscopy 11/15/2009 4 mm diminutive polyp at the splenic flexure - removed     Otherwise normal examination, excellent prep     Family history of colon cancer (father at 54) 5 year recall Path: 1. COLON, POLYP(S), SPLENIC FLEXURE : TUBULAR ADENOMA. NO HIGH GRADE DYSPLASIA   OR MALIGNANCY IDENTIFIED.    Past Medical History:  Diagnosis Date   Cancer [facility])    History of blood clots    in right calf   Hypertension     Past Surgical History:  Procedure Laterality Date   COLONOSCOPY     Last on was 2021 Done at the [place] said that they didnt find polyps 2016 they did find polyps but non cancerous. On a 5 year repeat   OTHER SURGICAL HISTORY     2017 at the [place] said that they had to operate on his flap of the stoamch lowerly   RADIOACTIVE SEED IMPLANT N/A 03/14/2021   Procedure: RADIOACTIVE SEED IMPLANT/BRACHYTHERAPY IMPLANT;  Surgeon: Rosalind Zachary NOVAK, MD;  Location: [facility];  Service: Urology;  Laterality: N/A;  90 MINS   SPACE OAR INSTILLATION N/A 03/14/2021   Procedure:  SPACE OAR INSTILLATION;  Surgeon: Rosalind Zachary NOVAK, MD;  Location: [facility];  Service: Urology;  Laterality: N/A;   TONSILLECTOMY      Current Outpatient Medications  Medication Sig Dispense Refill   amLODipine (NORVASC) 10 MG tablet Take 10 mg by mouth daily.     aspirin EC 81 MG tablet Take 81 mg by mouth daily. Swallow whole.     clobetasol cream (TEMOVATE) 0.05 % APPLY SMALL AMOUNT TO AFFECTED AREA TWICE A DAY     lisinopril (ZESTRIL) 40 MG tablet Take 40 mg by mouth daily.      tamsulosin (FLOMAX) 0.4 MG CAPS capsule Take 0.4 mg by mouth 2 (two) times daily.     No current facility-administered medications for this visit.    Allergies as of 12/12/2023 - Review Complete 12/12/2023  Allergen Reaction Noted   Penicillins Nausea And Vomiting and Rash 11/04/2009    Family History  Problem Relation Age of Onset   Hypertension Mother    Hypertension Father    Eczema Father    Asthma Brother    Sarcoidosis Neg Hx    Colon cancer Neg Hx    Esophageal cancer Neg Hx     Social History   Tobacco Use   Smoking status: Every Day    Types: Cigars   Smokeless tobacco: Never   Tobacco comments:    1-2 cigars daily.  09/28/2021 hfb  Vaping Use   Vaping status: Never Used  Substance Use Topics   Alcohol use: Yes    Comment: occ   Drug use: No     Review of Systems:    Constitutional: No fever, chills Cardiovascular: No chest pain Respiratory: No SOB  Gastrointestinal: See HPI and otherwise negative   Physical Exam:  Vital signs: BP (!) 150/88   Pulse 71   Ht 6' 5 (1.956 m)   Wt 221 lb 6 oz (100.4 kg)   BMI 26.25 kg/m   Constitutional: Pleasant male in NAD, alert and cooperative Head:  Normocephalic and atraumatic.  Eyes: No scleral icterus.  Respiratory: Respirations even and unlabored. Lungs clear to auscultation bilaterally.  No wheezes, crackles, or rhonchi.  Cardiovascular:  Regular rate and rhythm. No murmurs. No peripheral edema. Gastrointestinal:  Soft, nondistended, nontender. No rebound or guarding. Normal bowel sounds. No appreciable masses or hepatomegaly. Rectal:  Not performed.  Neurologic:  Alert and oriented x4;  grossly normal neurologically.  Skin:   Dry and intact without significant lesions or rashes. Psychiatric: Oriented to person, place and time. Demonstrates good judgement and reason without abnormal affect or behaviors.   RELEVANT LABS AND IMAGING: CBC    Component Value Date/Time   WBC 3.6 (L) 03/07/2021 0901   RBC  5.00 03/07/2021 0901   HGB 15.1 03/07/2021 0901   HCT 45.9 03/07/2021 0901   PLT 200 03/07/2021 0901   MCV 91.8 03/07/2021 0901   MCH 30.2 03/07/2021 0901   MCHC 32.9 03/07/2021 0901   RDW 13.4 03/07/2021 0901   LYMPHSABS 1.1 10/24/2006 1845   MONOABS 1.0 (H) 10/24/2006 1845   EOSABS 0.0 10/24/2006 1845   BASOSABS 0.1 10/24/2006 1845    CMP     Component Value Date/Time   NA 140 03/07/2021 0901   K 4.1 03/07/2021 0901   CL 105 03/07/2021 0901   CO2 28 03/07/2021 0901   GLUCOSE 104 (H) 03/07/2021 0901   BUN 30 (H) 03/07/2021 0901   CREATININE 1.68 (H) 03/07/2021 0901   CALCIUM 8.9 03/07/2021  0901   PROT 7.4 03/07/2021 0901   ALBUMIN 4.0 03/07/2021 0901   AST 40 03/07/2021 0901   ALT 48 (H) 03/07/2021 0901   ALKPHOS 52 03/07/2021 0901   BILITOT 0.6 03/07/2021 0901   GFRNONAA 47 (L) 03/07/2021 0901   GFRAA 56 (L) 04/27/2015 1950     Assessment/Plan:   Abnormal finding on CT imaging History of achalasia Iron deficiency Patient seen today to discuss scheduling EGD with EUS, [facility].  CT 07/04/2023 showed possible 4.2 cm mass posterior to the gastric cardia near the gastroesophageal junction, which could represent a gastric cardia diverticulum however gastrointestinal stromal tumor is in the differential. On labs 08/07/2023 CBC showed hemoglobin of 14.2, hematocrit 42.4, MCV 91.  GFR 42, BUN 29, creatinine 1.85.  ALT 47, AST 51, bilirubin 0.5 Iron panel 02/19/2023 showed low iron 51, low iron saturation 17, ferritin 114  - Plan for EGD with EUS at a date to be determined. Will send case to Dr. Wilhelmenia for review.  History of adenomatous colon polyps History of colon cancer - father, age 63 Last colonoscopy done in 2021 with finding of 1 tubular adenoma and 1 hyperplastic polyp.  Recommended recall 5 years.  Due next year for repeat.  Previously seen by Dr. Avram for procedure in 2011. Since then has been getting  colonoscopies done at [facility].   Camie Furbish, PA-C [facility] 12/12/2023, 11:23 AM  There is another [place] referral for colonoscopy that was discovered in the chart - patient had 1 cm TV adenoma removed in 2022 per that note.  Will schedule a surveillance colonoscopy.  Dr. Wilhelmenia will address the EUS indication (will schedule EUS with him).  Lupita CHARLENA Avram, MD, [facility]    Patient Care Team: [facility] as PCP - General Rosalind Zachary NOVAK, MD (Inactive) as Consulting Physician (Urology) Patrcia Cough, MD as Consulting Physician (Radiation Oncology)

## 2023-12-12 NOTE — Patient Instructions (Signed)
 Dr Mansouraty's nurse Odetta Curly RN will be in contact with you to schedule an EGD/EUS as soon as your records are reviewed by Dr Wilhelmenia   _______________________________________________________  If your blood pressure at your visit was 140/90 or greater, please contact your primary care physician to follow up on this.  _______________________________________________________  If you are age 60 or older, your body mass index should be between 23-30. Your Body mass index is 26.25 kg/m. If this is out of the aforementioned range listed, please consider follow up with your Primary Care Provider.  If you are age 60 or younger, your body mass index should be between 19-25. Your Body mass index is 26.25 kg/m. If this is out of the aformentioned range listed, please consider follow up with your Primary Care Provider.   ________________________________________________________  The [facility] providers would like to encourage you to use MYCHART to communicate with providers for non-urgent requests or questions.  Due to long hold times on the telephone, sending your provider a message by [facility] may be a faster and more efficient way to get a response.  Please allow 48 business hours for a response.  Please remember that this is for non-urgent requests.  _______________________________________________________  [facility] is using a team-based approach to care.  Your team is made up of your doctor and two to three APPS. Our APPS (Nurse Practitioners and Physician Assistants) work with your physician to ensure care continuity for you. They are fully qualified to address your health concerns and develop a treatment plan. They communicate directly with your gastroenterologist to care for you. Seeing the Advanced Practice Practitioners on your physician's team can help you by facilitating care more promptly, often allowing for earlier appointments, access to diagnostic testing, procedures, and  other specialty referrals.    Due to recent changes in healthcare laws, you may see the results of your imaging and laboratory studies on MyChart before your provider has had a chance to review them.  We understand that in some cases there may be results that are confusing or concerning to you. Not all laboratory results come back in the same time frame and the provider may be waiting for multiple results in order to interpret others.  Please give us  48 hours in order for your provider to thoroughly review all the results before contacting the office for clarification of your results.    I appreciate the  opportunity to care for you  Thank You   Camie Heinz,PA-C

## 2023-12-14 ENCOUNTER — Encounter: Payer: Self-pay | Admitting: Gastroenterology

## 2023-12-17 ENCOUNTER — Encounter: Payer: Self-pay | Admitting: Internal Medicine

## 2023-12-17 ENCOUNTER — Telehealth: Payer: Self-pay

## 2023-12-17 DIAGNOSIS — Z860101 Personal history of adenomatous and serrated colon polyps: Secondary | ICD-10-CM | POA: Insufficient documentation

## 2023-12-17 NOTE — Telephone Encounter (Signed)
-----   Message from [facility] sent at 12/17/2023  1:02 PM EDT ----- Odetta, Can schedule Upper EUS as available with me. GM ----- Message ----- From: Avram Lupita BRAVO, MD Sent: 12/17/2023  12:50 PM EDT To: Camie BRAVO Furbish, PA-C; Odetta LITTIE Curly, RN; Gabr#  Thanks Gabe,  I agree he needs a colonoscopy and we can schedule that with me in the [facility].  Elise Knobloch - please explain that we found another referral for colonoscopy due to polyp in 2022 and schedule an [facility] colonoscopy w/ me   Will let GM advise further on the EUS  [facility] ----- Message ----- From: Wilhelmenia Aloha Raddle., MD Sent: 12/17/2023   8:52 AM EDT To: Camie BRAVO Furbish, PA-C; Lupita BRAVO Avram, MD  [facility] and [facility], Happy to be available for upper EUS. When I went through the second ambulatory referral in media chart, they make mention of a 2022 colonoscopy that had a 1 cm TA removed.  That would likely mean a 3-year follow-up colonoscopy. Unfortunately I cannot accommodate EUS and colonoscopy on the same date. I am willing to move forward with scheduling an upper EUS in the coming months, pending availability if you all agree. Colonoscopy will need to be completed otherwise by [facility] if it is needed (when you review the media tab again). Thanks. Let me know and we can forward to Darry Kelnhofer to begin on scheduling. GM ----- Message ----- From: Furbish Camie BRAVO DEVONNA Sent: 12/14/2023   4:45 PM EDT To: Lupita BRAVO Avram, MD; Aloha Wilhelmenia Raddle., #  Dr. 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 Please review.   Dr. Avram,  Sending to you as well since you have seen patient in the past for colonoscopy in 2011. Since then he has had repeat colonoscopies at [facility].   Thank you,  SH

## 2023-12-19 ENCOUNTER — Other Ambulatory Visit: Payer: Self-pay

## 2023-12-19 DIAGNOSIS — R9389 Abnormal findings on diagnostic imaging of other specified body structures: Secondary | ICD-10-CM

## 2023-12-19 DIAGNOSIS — D214 Benign neoplasm of connective and other soft tissue of abdomen: Secondary | ICD-10-CM

## 2023-12-19 NOTE — Telephone Encounter (Signed)
 Left message on machine to call back

## 2023-12-19 NOTE — Telephone Encounter (Signed)
 EUS has been set up for 02/04/24 at [facility] with GM at 845 am

## 2023-12-20 ENCOUNTER — Other Ambulatory Visit: Payer: Self-pay | Admitting: Medical Genetics

## 2023-12-20 NOTE — Telephone Encounter (Signed)
 EUS scheduled, pt instructed and medications reviewed.  Patient instructions mailed to home.  Patient to call with any questions or concerns.   The [facility] and call back if/when he is ready to set up.

## 2023-12-20 NOTE — Telephone Encounter (Signed)
 Left message on machine to call back

## 2024-01-28 ENCOUNTER — Encounter (HOSPITAL_COMMUNITY): Payer: Self-pay | Admitting: Gastroenterology

## 2024-01-28 NOTE — Progress Notes (Signed)
 Attempted to obtain medical history for pre op call via telephone, unable to reach at this time. HIPAA compliant voicemail message left requesting return call to pre surgical testing department.

## 2024-02-04 ENCOUNTER — Encounter (HOSPITAL_COMMUNITY): Payer: Self-pay | Admitting: Gastroenterology

## 2024-02-04 ENCOUNTER — Other Ambulatory Visit: Payer: Self-pay

## 2024-02-04 ENCOUNTER — Encounter (HOSPITAL_COMMUNITY)
Admission: RE | Disposition: A | Discharge status: Home / Self Care | Payer: Self-pay | Source: Home / Self Care | Attending: Gastroenterology

## 2024-02-04 ENCOUNTER — Ambulatory Visit (HOSPITAL_COMMUNITY)

## 2024-02-04 ENCOUNTER — Ambulatory Visit (HOSPITAL_COMMUNITY)
Admission: RE | Admit: 2024-02-04 | Discharge: 2024-02-04 | Disposition: A | Discharge status: Home / Self Care | Attending: Gastroenterology | Admitting: Gastroenterology

## 2024-02-04 DIAGNOSIS — K220 Achalasia of cardia: Secondary | ICD-10-CM | POA: Diagnosis not present

## 2024-02-04 DIAGNOSIS — R590 Localized enlarged lymph nodes: Secondary | ICD-10-CM | POA: Diagnosis not present

## 2024-02-04 DIAGNOSIS — F17210 Nicotine dependence, cigarettes, uncomplicated: Secondary | ICD-10-CM

## 2024-02-04 DIAGNOSIS — D214 Benign neoplasm of connective and other soft tissue of abdomen: Secondary | ICD-10-CM | POA: Diagnosis present

## 2024-02-04 DIAGNOSIS — K2289 Other specified disease of esophagus: Secondary | ICD-10-CM | POA: Insufficient documentation

## 2024-02-04 DIAGNOSIS — D863 Sarcoidosis of skin: Secondary | ICD-10-CM | POA: Insufficient documentation

## 2024-02-04 DIAGNOSIS — K2980 Duodenitis without bleeding: Secondary | ICD-10-CM | POA: Insufficient documentation

## 2024-02-04 DIAGNOSIS — R933 Abnormal findings on diagnostic imaging of other parts of digestive tract: Secondary | ICD-10-CM | POA: Insufficient documentation

## 2024-02-04 DIAGNOSIS — Q399 Congenital malformation of esophagus, unspecified: Secondary | ICD-10-CM | POA: Insufficient documentation

## 2024-02-04 DIAGNOSIS — J9859 Other diseases of mediastinum, not elsewhere classified: Secondary | ICD-10-CM | POA: Insufficient documentation

## 2024-02-04 DIAGNOSIS — R9389 Abnormal findings on diagnostic imaging of other specified body structures: Secondary | ICD-10-CM

## 2024-02-04 DIAGNOSIS — I10 Essential (primary) hypertension: Secondary | ICD-10-CM | POA: Insufficient documentation

## 2024-02-04 DIAGNOSIS — K209 Esophagitis, unspecified without bleeding: Secondary | ICD-10-CM

## 2024-02-04 DIAGNOSIS — F17200 Nicotine dependence, unspecified, uncomplicated: Secondary | ICD-10-CM | POA: Diagnosis not present

## 2024-02-04 DIAGNOSIS — K2970 Gastritis, unspecified, without bleeding: Secondary | ICD-10-CM

## 2024-02-04 DIAGNOSIS — K314 Gastric diverticulum: Secondary | ICD-10-CM | POA: Diagnosis not present

## 2024-02-04 DIAGNOSIS — K3189 Other diseases of stomach and duodenum: Secondary | ICD-10-CM | POA: Insufficient documentation

## 2024-02-04 HISTORY — PX: ESOPHAGOGASTRODUODENOSCOPY: SHX5428

## 2024-02-04 HISTORY — PX: EUS: SHX5427

## 2024-02-04 SURGERY — EGD (ESOPHAGOGASTRODUODENOSCOPY)
Anesthesia: Monitor Anesthesia Care

## 2024-02-04 MED ORDER — PROPOFOL 500 MG/50ML IV EMUL
INTRAVENOUS | Status: DC | PRN
  Administered 2024-02-04: 180 ug/kg/min via INTRAVENOUS

## 2024-02-04 MED ORDER — LIDOCAINE 2% (20 MG/ML) 5 ML SYRINGE
INTRAMUSCULAR | Status: DC | PRN
  Administered 2024-02-04: 40 mg via INTRAVENOUS

## 2024-02-04 MED ORDER — DEXMEDETOMIDINE HCL IN NACL 80 MCG/20ML IV SOLN
INTRAVENOUS | Status: DC | PRN
Start: 2024-02-04 — End: 2024-02-04
  Administered 2024-02-04: 8 ug via INTRAVENOUS

## 2024-02-04 MED ORDER — PANTOPRAZOLE SODIUM 40 MG PO TBEC: 40.0000 mg | DELAYED_RELEASE_TABLET | Freq: Two times a day (BID) | ORAL | 12 refills | Status: AC

## 2024-02-04 MED ORDER — PROPOFOL 10 MG/ML IV BOLUS
INTRAVENOUS | Status: DC | PRN
  Administered 2024-02-04 (×3): 20 mg via INTRAVENOUS

## 2024-02-04 MED ORDER — SODIUM CHLORIDE 0.9 % IV SOLN: INTRAVENOUS | Status: DC

## 2024-02-04 NOTE — Anesthesia Postprocedure Evaluation (Signed)
 Anesthesia Post Note  Patient: Wayne Mills  Procedure(s) Performed: ULTRASOUND, UPPER GI TRACT, ENDOSCOPIC EGD (ESOPHAGOGASTRODUODENOSCOPY)     Patient location during evaluation: PACU Anesthesia Type: MAC Level of consciousness: awake and alert Pain management: pain level controlled Vital Signs Assessment: post-procedure vital signs reviewed and stable Respiratory status: spontaneous breathing, nonlabored ventilation, respiratory function stable and patient connected to nasal cannula oxygen Cardiovascular status: stable and blood pressure returned to baseline Postop Assessment: no apparent nausea or vomiting Anesthetic complications: no   No notable events documented.  Last Vitals:  Vitals:   02/04/24 0756 02/04/24 1039  BP: (!) 193/90 119/71  Pulse:  (!) 44  Resp: 12 13  Temp: (!) 36.4 C (!) 36.3 C  SpO2: 100% 100%    Last Pain:  Vitals:   02/04/24 1039  TempSrc: Temporal  PainSc: 0-No pain                 Thom JONELLE Peoples

## 2024-02-04 NOTE — Op Note (Signed)
 [facility] Patient Name: Wayne Mills Procedure Date: 02/04/2024 MRN: [id number] Attending MD: Aloha Finner , MD, [id number] Date of Birth: [birth year] CSN: [id number] Age: 58 Admit Type: Outpatient Procedure:                Upper EUS Indications:              Suspected mass in stomach on CT scan Providers:                Aloha Finner, MD, Randall Lines, RN, [facility]                            [place], Technician, Leighton Agent, CRNA Referring MD:              Medicines:                Monitored Anesthesia Care Complications:            No immediate complications. Estimated Blood Loss:     Estimated blood loss was minimal. Procedure:                Pre-Anesthesia Assessment:                           - Prior to the procedure, a History and Physical                            was performed, and patient medications and                            allergies were reviewed. The patient's tolerance of                            previous anesthesia was also reviewed. The risks                            and benefits of the procedure and the sedation                            options and risks were discussed with the patient.                            All questions were answered, and informed consent                            was obtained. Prior Anticoagulants: The patient has                            taken no anticoagulant or antiplatelet agents                            except for aspirin. ASA Grade Assessment: II - A                            patient with mild systemic disease. After reviewing  the risks and benefits, the patient was deemed in                            satisfactory condition to undergo the procedure.                           After obtaining informed consent, the endoscope was                            passed under direct vision. Throughout the                            procedure, the patient's blood pressure,  pulse, and                            oxygen saturations were monitored continuously. The                            [id number]-[id number] ([id number]) Olympus endoscope was introduced                            through the mouth, and advanced to the second part                            of duodenum. The [id number]-[id number] ([id number]) Olympus                            endosonoscope was introduced through the mouth, and                            advanced to the stomach for ultrasound examination                            from the esophagus and stomach. The upper EUS was                            accomplished without difficulty. The patient                            tolerated the procedure. Scope In: Scope Out: Findings:      ENDOSCOPIC FINDING: :      The examined esophagus was moderately tortuous.      LA Grade C (one or more mucosal breaks continuous between tops of 2 or       more mucosal folds, less than 75% circumference) esophagitis with no       bleeding was found in the distal esophagus.      No other gross lesions were noted in the entire esophagus.      The Z-line was irregular and was found 43 cm from the incisors.      A large non-bleeding diverticulum was found in the gastric fundus.      Striped mildly erythematous mucosa without bleeding was found in the       gastric antrum.      No other gross lesions were noted in the entire  examined stomach.       Biopsies were taken with a cold forceps for histology and Helicobacter       pylori testing.      No gross lesions were noted in the duodenal bulb, in the first portion       of the duodenum and in the second portion of the duodenum.      ENDOSONOGRAPHIC FINDING: :      Endosonographic images of the stomach were unremarkable. No masses, no       cysts and no wall thickening were identified.      There was no sign of significant endosonographic abnormality in the       thoracic esophagus and in the gastroesophageal junction. No masses, no        cysts and no wall thickening were identified.      A single irregularly shaped lesion was found in the visualized portion       of the mediastinum with the ultrasound probe positioned in the lower       third of the esophagus. This lesion was heterogenous, calcified and       characterized by shadowing. It measured 21 mm by 14 mm in maximal       cross-sectional diameter.      One enlarged lymph node was visualized in the lower paraesophageal       mediastinum (level 8L). It measured 8 mm by 6 mm in maximal       cross-sectional diameter. The node was triangular, isoechoic and had a       shadowing calcification and had well defined margins.      The celiac region was visualized. Impression:               EGD Impression:                           - Tortuous esophagus.                           - LA Grade C esophagitis with no bleeding found                            distally.                           - No other gross lesions in the entire esophagus.                           - Z-line irregular, 43 cm from the incisors.                           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                           - Erythematous mucosa in the antrum. No other gross                            lesions in the entire stomach. Biopsied.                           - No gross lesions in  the duodenal bulb, in the                            first portion of the duodenum and in the second                            portion of the duodenum.                           EUS Impression:                           - Endosonographic images of the stomach were                            unremarkable. No evidence of a GIST or smooth                            muscle lesion (per report of the CT - we never got                            it for visualization, this was in the region near                            fundus thus correlates with large fundal                            diverticulum).                            - There was no sign of significant pathology in the                            thoracic esophagus and in the gastroesophageal                            junction.                           - A single heterogenous, calcified and                            characterized by shadowing lesion was found in the                            mediastinum. 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 I do not know if  there has been additional CT-Chest imaging. This                            could be sampled, if deemed necessary with EUS, but                            would need updated imaging and discussion with                            Pulmonary before considering this.                           - One enlarged lymph node was visualized in the                            lower paraesophageal mediastinum (level 8L). Tissue                            has not been obtained. However, the endosonographic                            appearance is suggestive of benign inflammatory                            changes but Sarcoidosis could also be a                            possibility. With the significant esophagitis,                            could just be inflammatory in nature. Moderate Sedation:      Not Applicable - Patient had care per Anesthesia. Recommendation:           - The patient will be observed post-procedure,                            until all discharge criteria are met.                           - Discharge patient to home.                           - Patient has a contact number available for                            emergencies. The signs and symptoms of potential                            delayed complications were discussed with the                            patient. Return to normal activities tomorrow.  Written discharge instructions were provided to the                            patient.                           - Resume previous diet.                           - Start PPI 40 mg BID.                           - Await path results.                           - Recommend repeat EGD in 3-4 months with primary                            GI.                           - Observe patient's clinical course.                           - Consider CT-Chest imaging and potentially repeat                            CT-Abdomen (though may be able to get prior imaging                            as well to compare - will need from [facility]) and                            followup with Pulmonary to decide next steps in                            evaluation (not clear if he is seeing someone at                            the [facility] or still with Dr. Meade of [facility]                            - who is leaving practice soon).                           - EUS for sampling can be considered if deemed                            necessary in future however.                           - Discussed with patient/family. Procedure Code(s):        --- Professional ---  56762, Esophagogastroduodenoscopy, flexible,                            transoral; with endoscopic ultrasound examination                            limited to the esophagus, stomach or duodenum, and                            adjacent structures                           43239, Esophagogastroduodenoscopy, flexible,                            transoral; with biopsy, single or multiple Diagnosis Code(s):        --- Professional ---                           Q39.9, Congenital malformation of esophagus,                            unspecified                           K20.90, Esophagitis, unspecified without bleeding                           K22.89, Other specified disease of esophagus                            K31.4, Gastric diverticulum                           K31.89, Other diseases of stomach and duodenum                           J98.59, Other diseases of mediastinum, not                            elsewhere classified                           R59.0, Localized enlarged lymph nodes                           R93.3, Abnormal findings on diagnostic imaging of                            other parts of digestive tract CPT copyright 2022 American [employer] Association. All rights reserved. The codes documented in this report are preliminary and upon coder review may  be revised to meet current compliance requirements. Aloha Finner, MD 02/04/2024 10:51:01 AM Number of Addenda: 0

## 2024-02-04 NOTE — Discharge Instructions (Signed)

## 2024-02-04 NOTE — Anesthesia Preprocedure Evaluation (Signed)
 Anesthesia Evaluation  Patient identified by MRN, date of birth, ID band Patient awake    Reviewed: Allergy & Precautions, H&P , NPO status , Patient's Chart, lab work & pertinent test results  History of Anesthesia Complications Negative for: history of anesthetic complications  Airway Mallampati: II  TM Distance: >3 FB Neck ROM: Full    Dental no notable dental hx.    Pulmonary Current Smoker and Patient abstained from smoking.   Pulmonary exam normal breath sounds clear to auscultation       Cardiovascular hypertension, (-) Past MI negative cardio ROS Normal cardiovascular exam Rhythm:Regular Rate:Normal     Neuro/Psych neg Seizures negative neurological ROS  negative psych ROS   GI/Hepatic Neg liver ROS,,,gastrointestinal stromal tumor  Achalasia   Endo/Other  negative endocrine ROS    Renal/GU negative Renal ROS   Hx of prostate caner    Musculoskeletal negative musculoskeletal ROS (+)    Abdominal   Peds negative pediatric ROS (+)  Hematology negative hematology ROS (+)   Anesthesia Other Findings   Reproductive/Obstetrics negative OB ROS                              Anesthesia Physical Anesthesia Plan  ASA: 2  Anesthesia Plan: MAC   Post-op Pain Management:    Induction: Intravenous  PONV Risk Score and Plan: Propofol  infusion and Treatment may vary due to age or medical condition  Airway Management Planned: Natural Airway  Additional Equipment:   Intra-op Plan:   Post-operative Plan:   Informed Consent: I have reviewed the patients History and Physical, chart, labs and discussed the procedure including the risks, benefits and alternatives for the proposed anesthesia with the patient or authorized representative who has indicated his/her understanding and acceptance.     Dental advisory given  Plan Discussed with: CRNA  Anesthesia Plan Comments:          Anesthesia Quick Evaluation

## 2024-02-04 NOTE — Transfer of Care (Signed)
 Immediate Anesthesia Transfer of Care Note  Patient: Wayne Mills  Procedure(s) Performed: ULTRASOUND, UPPER GI TRACT, ENDOSCOPIC EGD (ESOPHAGOGASTRODUODENOSCOPY)  Patient Location: Endoscopy Unit  Anesthesia Type:MAC  Level of Consciousness: sedated  Airway & Oxygen Therapy: Patient Spontanous Breathing and Patient connected to face mask oxygen  Post-op Assessment: Report given to RN and Post -op Vital signs reviewed and stable  Post vital signs: Reviewed and stable  Last Vitals:  Vitals Value Taken Time  BP    Temp    Pulse 44 02/04/24 10:38  Resp 13 02/04/24 10:38  SpO2 100 % 02/04/24 10:38  Vitals shown include unfiled device data.  Last Pain:  Vitals:   02/04/24 0756  TempSrc: Temporal  PainSc: 0-No pain      Patients Stated Pain Goal: 0 (02/04/24 0756)  Complications: No notable events documented.

## 2024-02-04 NOTE — H&P (Signed)
 GASTROENTEROLOGY PROCEDURE H&P NOTE   Primary Care Physician: [facility], [facility]  HPI: Wayne Mills is a 58 y.o. male who presents for Upper EUS to evaluate gastric SEL.  Past Medical History:  Diagnosis Date   Cancer [facility])    History of blood clots    in right calf   Hx of adenomatous polyp of colon 12/17/2023   2022 1 cm TV adenoma at [facility] - requested community surveillance colonoscopy    Hypertension    Past Surgical History:  Procedure Laterality Date   COLONOSCOPY     Last on was 2021 Done at the [facility] said that they didnt find polyps 2016 they did find polyps but non cancerous. On a 5 year repeat   OTHER SURGICAL HISTORY     2017 at the [place] said that they had to operate on his flap of the stoamch lowerly   RADIOACTIVE SEED IMPLANT N/A 03/14/2021   Procedure: RADIOACTIVE SEED IMPLANT/BRACHYTHERAPY IMPLANT;  Surgeon: Rosalind Zachary NOVAK, MD;  Location: [facility];  Service: Urology;  Laterality: N/A;  90 MINS   SPACE OAR INSTILLATION N/A 03/14/2021   Procedure: SPACE OAR INSTILLATION;  Surgeon: Rosalind Zachary NOVAK, MD;  Location: [facility];  Service: Urology;  Laterality: N/A;   TONSILLECTOMY     Current Facility-Administered Medications  Medication Dose Route Frequency Provider Last Rate Last Admin   0.9 %  sodium chloride  infusion   Intravenous Continuous Mansouraty, Aloha Raddle., MD        Current Facility-Administered Medications:    0.9 %  sodium chloride  infusion, , Intravenous, Continuous, Mansouraty, Aloha Raddle., MD Allergies  Allergen Reactions   Penicillins Nausea And Vomiting and Rash    REACTION: told as child   Family History  Problem Relation Age of Onset   Hypertension Mother    Hypertension Father    Eczema Father    Asthma Brother    Sarcoidosis Neg Hx    Colon cancer Neg Hx    Esophageal cancer Neg Hx    Social History   Socioeconomic History   Marital status: Married    Spouse name: Not on file   Number  of children: Not on file   Years of education: Not on file   Highest education level: Not on file  Occupational History   Not on file  Tobacco Use   Smoking status: Every Day    Types: Cigars   Smokeless tobacco: Never   Tobacco comments:    1-2 cigars daily.  09/28/2021 hfb  Vaping Use   Vaping status: Never Used  Substance and Sexual Activity   Alcohol use: Yes    Comment: occ   Drug use: No   Sexual activity: Yes  Other Topics Concern   Not on file  Social History Narrative   Not on file   Social Drivers of Health   Financial Resource Strain: Not on file  Food Insecurity: Not on file  Transportation Needs: Not on file  Physical Activity: Not on file  Stress: Not on file  Social Connections: Not on file  Intimate Partner Violence: Not on file    Physical Exam: Today's Vitals   02/04/24 0756  BP: (!) 193/90  Resp: 12  Temp: (!) 97.5 F (36.4 C)  TempSrc: Temporal  SpO2: 100%  Weight: 99.8 kg  Height: 6' 5 (1.956 m)  PainSc: 0-No pain   Body mass index is 26.09 kg/m. GEN: NAD EYE: Sclerae anicteric ENT: MMM CV: Non-tachycardic  GI: Soft, NT/ND NEURO:  Alert & Oriented x 3  Lab Results: No results for input(s): WBC, HGB, HCT, PLT in the last 72 hours. BMET No results for input(s): NA, K, CL, CO2, GLUCOSE, BUN, CREATININE, CALCIUM in the last 72 hours. LFT No results for input(s): PROT, ALBUMIN, AST, ALT, ALKPHOS, BILITOT, BILIDIR, IBILI in the last 72 hours. PT/INR No results for input(s): LABPROT, INR in the last 72 hours.   Impression / Plan: This is a 58 y.o.male who presents for Upper EUS to evaluate gastric SEL.  The risks of an EUS including intestinal perforation, bleeding, infection, aspiration, and medication effects were discussed as was the possibility it may not give a definitive diagnosis if a biopsy is performed.  When a biopsy of the pancreas is done as part of the EUS, there is an additional  risk of pancreatitis at the rate of about 1-2%.  It was explained that procedure related pancreatitis is typically mild, although it can be severe and even life threatening, which is why we do not perform random pancreatic biopsies and only biopsy a lesion/area we feel is concerning enough to warrant the risk.   The risks and benefits of endoscopic evaluation/treatment were discussed with the patient and/or family; these include but are not limited to the risk of perforation, infection, bleeding, missed lesions, lack of diagnosis, severe illness requiring hospitalization, as well as anesthesia and sedation related illnesses.  The patient's history has been reviewed, patient examined, no change in status, and deemed stable for procedure.  The patient and/or family is agreeable to proceed.    Aloha Finner, MD [facility] Office # [phone]

## 2024-02-05 ENCOUNTER — Telehealth: Payer: Self-pay

## 2024-02-05 ENCOUNTER — Encounter (HOSPITAL_COMMUNITY): Payer: Self-pay | Admitting: Gastroenterology

## 2024-02-05 LAB — SURGICAL PATHOLOGY

## 2024-02-05 NOTE — Telephone Encounter (Signed)
-----   Message from [facility] sent at 02/04/2024  5:25 PM EDT ----- Regarding: Follow-up Team, This patient does not have a gastric GIST or gastric lesion that is present, but rather a large gastric diverticulum. 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 He has a history of cutaneous sarcoidosis. He reports a pulmonologist that he follows with through the [facility].  He had previously seen Dr. Meade but is no longer seeing her. I think this patient needs an updated imaging study through [facility] rather than just the [facility] (and never got to see the [facility] CT scan). I recommend that we obtain a CT chest/abdomen/pelvis with IV and oral contrast. Because he is community care through the [facility], I am not sure if we have to go through them. Lets have our nurses see what they can do. Based on the findings of the CAT scan for the abnormal ultrasound findings of the mediastinum and the previous abnormal findings of the stomach and the lymph nodes noted, I think these are that we know sees enough to move forward with imaging.  Pod C nurses, Please order these imaging studies.  If we cannot get them ordered, you need to get in touch with the patient's primary care doctor through the [facility] so we can get these done locally rather than at the [facility] because we never get to see their imaging studies. Thanks. GM

## 2024-02-05 NOTE — Telephone Encounter (Signed)
 Community care request has been faxed to the [facility] for CT scan

## 2024-02-07 ENCOUNTER — Other Ambulatory Visit: Payer: Self-pay

## 2024-02-07 ENCOUNTER — Ambulatory Visit: Payer: Self-pay | Admitting: Gastroenterology

## 2024-02-07 DIAGNOSIS — D509 Iron deficiency anemia, unspecified: Secondary | ICD-10-CM

## 2024-02-07 DIAGNOSIS — R9389 Abnormal findings on diagnostic imaging of other specified body structures: Secondary | ICD-10-CM

## 2024-02-14 NOTE — Telephone Encounter (Signed)
 Thank you for this update. I was in the office earlier today and nothing as of yet. GM

## 2024-02-14 NOTE — Telephone Encounter (Signed)
 FYI incase the report is faxed please put on Dr Wilhelmenia desk thank you

## 2024-02-14 NOTE — Telephone Encounter (Signed)
 Inbound [place] from [place] with the [facility] advising he faxed over patient previous ct results from last may to [phone].   Also states radiology should be sending out a disc early next week.

## 2024-04-21 ENCOUNTER — Other Ambulatory Visit: Payer: Self-pay | Admitting: Medical Genetics

## 2024-04-21 DIAGNOSIS — Z006 Encounter for examination for normal comparison and control in clinical research program: Secondary | ICD-10-CM
